# Patient Record
Sex: Male | Born: 1960 | Race: White | Hispanic: No | Marital: Married | Smoking: Never smoker
Health system: Southern US, Community
[De-identification: ages and names within clinical notes are randomized; demographics above are authoritative.]

## PROBLEM LIST (undated history)

## (undated) DIAGNOSIS — H269 Unspecified cataract: Secondary | ICD-10-CM

## (undated) DIAGNOSIS — Z8619 Personal history of other infectious and parasitic diseases: Secondary | ICD-10-CM

## (undated) DIAGNOSIS — G709 Myoneural disorder, unspecified: Secondary | ICD-10-CM

## (undated) DIAGNOSIS — R42 Dizziness and giddiness: Secondary | ICD-10-CM

## (undated) HISTORY — DX: Unspecified cataract: H26.9

## (undated) HISTORY — DX: Myoneural disorder, unspecified: G70.9

## (undated) HISTORY — DX: Personal history of other infectious and parasitic diseases: Z86.19

---

## 2010-04-24 DIAGNOSIS — Z800 Family history of malignant neoplasm of digestive organs: Secondary | ICD-10-CM | POA: Insufficient documentation

## 2010-06-18 ENCOUNTER — Ambulatory Visit: Payer: Self-pay | Admitting: Gastroenterology

## 2010-06-20 LAB — PATHOLOGY REPORT

## 2013-11-22 LAB — BASIC METABOLIC PANEL
BUN: 17 mg/dL (ref 4–21)
Creatinine: 1 mg/dL (ref 0.6–1.3)
Glucose: 99 mg/dL
Potassium: 4.3 mmol/L (ref 3.4–5.3)
Sodium: 142 mmol/L (ref 137–147)

## 2013-11-22 LAB — LIPID PANEL
CHOLESTEROL: 190 mg/dL (ref 0–200)
HDL: 41 mg/dL (ref 35–70)
LDL CALC: 119 mg/dL
TRIGLYCERIDES: 149 mg/dL (ref 40–160)

## 2015-01-03 LAB — PSA: PSA: 0.7

## 2015-02-07 ENCOUNTER — Ambulatory Visit
Admit: 2015-02-07 | Disposition: A | Discharge status: Home / Self Care | Payer: Self-pay | Attending: Otolaryngology | Admitting: Otolaryngology

## 2015-10-09 ENCOUNTER — Ambulatory Visit: Payer: Managed Care, Other (non HMO) | Admitting: Anesthesiology

## 2015-10-09 ENCOUNTER — Encounter
Admission: RE | Disposition: A | Discharge status: Home / Self Care | Payer: Self-pay | Source: Ambulatory Visit | Attending: Gastroenterology

## 2015-10-09 ENCOUNTER — Ambulatory Visit
Admission: RE | Admit: 2015-10-09 | Discharge: 2015-10-09 | Disposition: A | Discharge status: Home / Self Care | Payer: Managed Care, Other (non HMO) | Source: Ambulatory Visit | Attending: Gastroenterology | Admitting: Gastroenterology

## 2015-10-09 DIAGNOSIS — D122 Benign neoplasm of ascending colon: Secondary | ICD-10-CM | POA: Diagnosis not present

## 2015-10-09 DIAGNOSIS — Z8371 Family history of colonic polyps: Secondary | ICD-10-CM | POA: Diagnosis present

## 2015-10-09 DIAGNOSIS — Z86010 Personal history of colonic polyps: Secondary | ICD-10-CM | POA: Diagnosis not present

## 2015-10-09 DIAGNOSIS — Z800 Family history of malignant neoplasm of digestive organs: Secondary | ICD-10-CM | POA: Insufficient documentation

## 2015-10-09 DIAGNOSIS — Z1211 Encounter for screening for malignant neoplasm of colon: Secondary | ICD-10-CM | POA: Diagnosis not present

## 2015-10-09 HISTORY — DX: Dizziness and giddiness: R42

## 2015-10-09 HISTORY — PX: COLONOSCOPY WITH PROPOFOL: SHX5780

## 2015-10-09 SURGERY — COLONOSCOPY WITH PROPOFOL
Anesthesia: General

## 2015-10-09 MED ORDER — PROPOFOL 500 MG/50ML IV EMUL
INTRAVENOUS | Status: DC | PRN
  Administered 2015-10-09: 80 ug/kg/min via INTRAVENOUS

## 2015-10-09 MED ORDER — LIDOCAINE HCL (CARDIAC) 20 MG/ML IV SOLN
INTRAVENOUS | Status: DC | PRN
  Administered 2015-10-09: 60 mg via INTRAVENOUS

## 2015-10-09 MED ORDER — PROPOFOL 10 MG/ML IV BOLUS
INTRAVENOUS | Status: DC | PRN
  Administered 2015-10-09 (×3): 20 mg via INTRAVENOUS

## 2015-10-09 MED ORDER — FENTANYL CITRATE (PF) 100 MCG/2ML IJ SOLN
INTRAMUSCULAR | Status: DC | PRN
  Administered 2015-10-09 (×2): 50 ug via INTRAVENOUS

## 2015-10-09 MED ORDER — SODIUM CHLORIDE 0.9 % IV SOLN: INTRAVENOUS | Status: DC

## 2015-10-09 MED ORDER — MIDAZOLAM HCL 2 MG/2ML IJ SOLN
INTRAMUSCULAR | Status: DC | PRN
  Administered 2015-10-09: 1 mg via INTRAVENOUS

## 2015-10-09 MED ORDER — SODIUM CHLORIDE 0.9 % IV SOLN
INTRAVENOUS | Status: DC
  Administered 2015-10-09: 1000 mL via INTRAVENOUS

## 2015-10-09 NOTE — Op Note (Signed)
[facility] Gastroenterology Patient Name: Paul Espinoza Procedure Date: 10/09/2015 10:31 AM MRN: [id number] Account #: [id number] Date of Birth: 1960-11-17 Admit Type: Outpatient Age: 54 Room: [facility] ENDO ROOM 4 Gender: Male Note Status: Finalized Procedure:         Colonoscopy Indications:       Family history of colon cancer in a first-degree relative,                     Family history of colonic polyps in a first-degree                     relative, Personal history of colonic polyps Providers:         Lupita Dawn. Candace Cruise, MD Referring MD:      Kirstie Peri. Caryn Section, MD (Referring MD) Medicines:         Monitored Anesthesia Care Complications:     No immediate complications. Procedure:         Pre-Anesthesia Assessment:                    - Prior to the procedure, a History and Physical was                     performed, and patient medications, allergies and                     sensitivities were reviewed. The patient's tolerance of                     previous anesthesia was reviewed.                    - The risks and benefits of the procedure and the sedation                     options and risks were discussed with the patient. All                     questions were answered and informed consent was obtained.                    - After reviewing the risks and benefits, the patient was                     deemed in satisfactory condition to undergo the procedure.                    After obtaining informed consent, the colonoscope was                     passed under direct vision. Throughout the procedure, the                     patient's blood pressure, pulse, and oxygen saturations                     were monitored continuously. The Olympus PCF-H180AL                     colonoscope ( S#: [id number] ) was introduced through the                     anus and advanced to the the cecum, identified by  appendiceal orifice and ileocecal valve. The colonoscopy                      was performed without difficulty. The patient tolerated                     the procedure well. The quality of the bowel preparation                     was good. Findings:      A small polyp was found in the ascending colon. The polyp was sessile.       The polyp was removed with a cold snare. Resection and retrieval were       complete.      The exam was otherwise without abnormality. Impression:        - One small polyp in the ascending colon. Resected and                     retrieved.                    - The examination was otherwise normal. Recommendation:    - Discharge patient to home.                    - Await pathology results.                    - Repeat colonoscopy in 5 years for surveillance.                    - The findings and recommendations were discussed with the                     patient. Procedure Code(s): --- Professional ---                    [phone], Colonoscopy, flexible; with removal of tumor(s),                     polyp(s), or other lesion(s) by snare technique Diagnosis Code(s): --- Professional ---                    D12.2, Benign neoplasm of ascending colon                    Z80.0, Family history of malignant neoplasm of digestive                     organs                    Z83.71, Family history of colonic polyps                    Z86.010, Personal history of colonic polyps CPT copyright 2014 American Medical Association. All rights reserved. The codes documented in this report are preliminary and upon coder review may  be revised to meet current compliance requirements. Hulen Luster, MD 10/09/2015 10:55:29 AM This report has been signed electronically. Number of Addenda: 0 Note Initiated On: 10/09/2015 10:31 AM Scope Withdrawal Time: 0 hours 7 minutes 15 seconds  Total Procedure Duration: 0 hours 14 minutes 48 seconds       [facility]

## 2015-10-09 NOTE — Transfer of Care (Signed)
Immediate Anesthesia Transfer of Care Note  Patient: Paul Espinoza  Procedure(s) Performed: Procedure(s): COLONOSCOPY WITH PROPOFOL (N/A)  Patient Location: PACU  Anesthesia Type:General  Level of Consciousness: awake, alert  and oriented  Airway & Oxygen Therapy: Patient Spontanous Breathing and Patient connected to nasal cannula oxygen  Post-op Assessment: Report given to RN and Post -op Vital signs reviewed and stable  Post vital signs: Reviewed and stable  Last Vitals:  Filed Vitals:   10/09/15 0931  BP: 127/73  Pulse: 54  Temp: 36.4 C  Resp: 19    Complications: No apparent anesthesia complications

## 2015-10-09 NOTE — Anesthesia Postprocedure Evaluation (Signed)
Anesthesia Post Note  Patient: Paul Espinoza  Procedure(s) Performed: Procedure(s) (LRB): COLONOSCOPY WITH PROPOFOL (N/A)  Patient location during evaluation: Endoscopy Anesthesia Type: General Level of consciousness: awake and alert Pain management: pain level controlled Vital Signs Assessment: post-procedure vital signs reviewed and stable Respiratory status: spontaneous breathing, nonlabored ventilation, respiratory function stable and patient connected to nasal cannula oxygen Cardiovascular status: blood pressure returned to baseline and stable Postop Assessment: no signs of nausea or vomiting Anesthetic complications: no    Last Vitals:  Filed Vitals:   10/09/15 1121 10/09/15 1131  BP: 117/74 119/60  Pulse: 50 50  Temp:    Resp: 12 14    Last Pain: There were no vitals filed for this visit.               Martha Clan

## 2015-10-09 NOTE — H&P (Signed)
    Primary Care Physician:  [name], MD Primary Gastroenterologist:  Dr. Candace Cruise  Pre-Procedure History & Physical: HPI:  Paul Espinoza is a 54 y.o. male is here for an colonoscopy.   Past Medical History  Diagnosis Date  . Vertigo     Past Surgical History  Procedure Laterality Date  . Colonoscopy      Prior to Admission medications   Medication Sig Start Date End Date Taking? Authorizing Provider  fluticasone (FLONASE) 50 MCG/ACT nasal spray Place 2 sprays into both nostrils daily.   Yes Historical Provider, MD  hydrochlorothiazide (HYDRODIURIL) 25 MG tablet Take 25 mg by mouth daily.   Yes Historical Provider, MD  Multiple Vitamin (MULTIVITAMIN) capsule Take 1 capsule by mouth daily.   Yes Historical Provider, MD    Allergies as of 06/21/2015  . (Not on File)    History reviewed. No pertinent family history.  Social History   Social History  . Marital Status: Married    Spouse Name: N/A  . Number of Children: N/A  . Years of Education: N/A   Occupational History  . Not on file.   Social History Main Topics  . Smoking status: Never Smoker   . Smokeless tobacco: Not on file  . Alcohol Use: Not on file  . Drug Use: Not on file  . Sexual Activity: Not on file   Other Topics Concern  . Not on file   Social History Narrative  . No narrative on file    Review of Systems: See HPI, otherwise negative ROS  Physical Exam: BP 127/73 mmHg  Pulse 54  Temp(Src) 97.6 F (36.4 C) (Tympanic)  Resp 19  Ht 6\' 5"  (1.956 m)  Wt 87.091 kg (192 lb)  BMI 22.76 kg/m2  SpO2 100% General:   Alert,  pleasant and cooperative in NAD Head:  Normocephalic and atraumatic. Neck:  Supple; no masses or thyromegaly. Lungs:  Clear throughout to auscultation.    Heart:  Regular rate and rhythm. Abdomen:  Soft, nontender and nondistended. Normal bowel sounds, without guarding, and without rebound.   Neurologic:  Alert and  oriented x4;  grossly normal  neurologically.  Impression/Plan: Paul Espinoza is here for an colonoscopy to be performed for personal hx of colon polyps/family hx of colon polyps/cancer  Risks, benefits, limitations, and alternatives regarding  colonoscoppy have been reviewed with the patient.  Questions have been answered.  All parties agreeable.   Delfina Schreurs, Lupita Dawn, MD  10/09/2015, 9:36 AM

## 2015-10-09 NOTE — Anesthesia Preprocedure Evaluation (Signed)
Anesthesia Evaluation  Patient identified by MRN, date of birth, ID band Patient awake    Reviewed: Allergy & Precautions, H&P , NPO status , Patient's Chart, lab work & pertinent test results, reviewed documented beta blocker date and time   History of Anesthesia Complications Negative for: history of anesthetic complications  Airway Mallampati: I  TM Distance: >3 FB Neck ROM: full    Dental no notable dental hx. (+) Caps, Teeth Intact   Pulmonary neg pulmonary ROS,    Pulmonary exam normal breath sounds clear to auscultation       Cardiovascular Exercise Tolerance: Good negative cardio ROS Normal cardiovascular exam Rhythm:regular Rate:Normal     Neuro/Psych negative neurological ROS  negative psych ROS   GI/Hepatic negative GI ROS, Neg liver ROS,   Endo/Other  negative endocrine ROS  Renal/GU negative Renal ROS  negative genitourinary   Musculoskeletal   Abdominal   Peds  Hematology negative hematology ROS (+)   Anesthesia Other Findings Past Medical History:   Vertigo                                                      Reproductive/Obstetrics negative OB ROS                             Anesthesia Physical Anesthesia Plan  ASA: I  Anesthesia Plan: General   Post-op Pain Management:    Induction:   Airway Management Planned:   Additional Equipment:   Intra-op Plan:   Post-operative Plan:   Informed Consent: I have reviewed the patients History and Physical, chart, labs and discussed the procedure including the risks, benefits and alternatives for the proposed anesthesia with the patient or authorized representative who has indicated his/her understanding and acceptance.   Dental Advisory Given  Plan Discussed with: Anesthesiologist, CRNA and Surgeon  Anesthesia Plan Comments:         Anesthesia Quick Evaluation

## 2015-10-10 DIAGNOSIS — Z86010 Personal history of colonic polyps: Secondary | ICD-10-CM

## 2015-10-10 LAB — SURGICAL PATHOLOGY

## 2015-12-15 DIAGNOSIS — H8109 Meniere's disease, unspecified ear: Secondary | ICD-10-CM | POA: Insufficient documentation

## 2016-01-09 ENCOUNTER — Ambulatory Visit (INDEPENDENT_AMBULATORY_CARE_PROVIDER_SITE_OTHER): Payer: Managed Care, Other (non HMO) | Admitting: Family Medicine

## 2016-01-09 ENCOUNTER — Encounter: Payer: Self-pay | Admitting: Family Medicine

## 2016-01-09 VITALS — BP 102/64 | HR 72 | Temp 98.1°F | Resp 16 | Ht 77.0 in | Wt 206.0 lb

## 2016-01-09 DIAGNOSIS — Z0000 Encounter for general adult medical examination without abnormal findings: Secondary | ICD-10-CM | POA: Diagnosis not present

## 2016-01-09 DIAGNOSIS — R42 Dizziness and giddiness: Secondary | ICD-10-CM | POA: Diagnosis not present

## 2016-01-09 DIAGNOSIS — R002 Palpitations: Secondary | ICD-10-CM | POA: Diagnosis not present

## 2016-01-09 NOTE — Progress Notes (Signed)
Patient: Paul Espinoza, Male    DOB: 02-Nov-1961, 55 y.o.   MRN: [id number] Visit Date: 01/09/2016  Today's Provider: [name], MD   Chief Complaint  Patient presents with  . Annual Exam   Subjective:    Annual physical exam [name] is a 55 y.o. male who presents today for health maintenance and complete physical. He feels well. He reports exercising yes/running. He reports he is sleeping well.  ----------------------------------------------------------------- He has generally felt well. He states that last weekend he had just finished a 7 mile run when he started having several brief dizzy spells and nearly passed out. They were associated with heart palpitations, but no pain or dyspnea. He reports several episodes of palpitations over his life time which were mostly associated with caffeine consumption. He states he has not consumed caffeine recently. Has not been taking any new vitamins or supplements, and has not been otherwise ill recently. He does take 68mc hctz daily for Meniere's disease which he states has been fairly effective.   Review of Systems  Constitutional: Negative.   HENT: Positive for hearing loss and tinnitus.   Eyes: Positive for visual disturbance.  Respiratory: Negative.   Cardiovascular: Positive for palpitations.  Gastrointestinal: Negative.   Endocrine: Negative.   Genitourinary: Negative.   Musculoskeletal: Negative.   Skin: Negative.   Allergic/Immunologic: Negative.   Neurological: Positive for light-headedness.  Hematological: Negative.   Psychiatric/Behavioral: Negative.     Social History      He  reports that he has never smoked. He does not have any smokeless tobacco history on file.       Social History   Social History  . Marital Status: Married    Spouse Name: N/A  . Number of Children: N/A  . Years of Education: N/A   Social History Main Topics  . Smoking status: Never Smoker   . Smokeless tobacco: None   . Alcohol Use: None  . Drug Use: None  . Sexual Activity: Not Asked   Other Topics Concern  . None   Social History Narrative    Past Medical History  Diagnosis Date  . Vertigo      Patient Active Problem List   Diagnosis Date Noted  . Dizziness 01/09/2016  . Palpitations 01/09/2016  . Meniere disease 12/15/2015  . History of adenomatous polyp of colon 10/10/2015  . Family history of cancer of digestive system 04/24/2010    Past Surgical History  Procedure Laterality Date  . Colonoscopy with propofol N/A 10/09/2015    Procedure: COLONOSCOPY WITH PROPOFOL;  Surgeon: Hulen Luster, MD;  Location: [facility] ENDOSCOPY;  Service: Gastroenterology;  Laterality: N/A;    Family History        Family Status  Relation Status Death Age  . Mother Deceased   . Father Deceased 69  . Brother Alive   . Brother Alive   . Brother Alive         His family history includes Breast cancer in his mother; Colon cancer in his brother and father; Colon polyps in his father and mother; Heart disease in his father and mother.  His father had MI in his 64s and had 4V CABG.   No Known Allergies  Previous Medications   FLUTICASONE (FLONASE) 50 MCG/ACT NASAL SPRAY    Place 2 sprays into both nostrils daily.   HYDROCHLOROTHIAZIDE (HYDRODIURIL) 25 MG TABLET    Take 25 mg by mouth daily.   MULTIPLE VITAMIN (MULTIVITAMIN)  CAPSULE    Take 1 capsule by mouth daily.    Patient Care Team: Birdie Sons, MD as PCP - General (Family Medicine)     Objective:   Vitals: BP 102/64 mmHg  Pulse 72  Temp(Src) 98.1 F (36.7 C) (Oral)  Resp 16  Ht 6\' 5"  (1.956 m)  Wt 206 lb (93.441 kg)  BMI 24.42 kg/m2  SpO2 98%   Physical Exam   General Appearance:    Alert, cooperative, no distress, appears stated age  Head:    Normocephalic, without obvious abnormality, atraumatic  Eyes:    PERRL, conjunctiva/corneas clear, EOM's intact, fundi    benign, both eyes       Ears:    Normal TM's and external ear  canals, both ears  Nose:   Nares normal, septum midline, mucosa normal, no drainage   or sinus tenderness  Throat:   Lips, mucosa, and tongue normal; teeth and gums normal  Neck:   Supple, symmetrical, trachea midline, no adenopathy;       thyroid:  No enlargement/tenderness/nodules; no carotid   bruit or JVD  Back:     Symmetric, no curvature, ROM normal, no CVA tenderness  Lungs:     Clear to auscultation bilaterally, respirations unlabored  Chest wall:    No tenderness or deformity  Heart:    Regular rate and rhythm, S1 and S2 normal, no murmur, rub   or gallop  Abdomen:     Soft, non-tender, bowel sounds active all four quadrants,    no masses, no organomegaly  Genitalia:    deferred  Rectal:    deferred  Extremities:   Extremities normal, atraumatic, no cyanosis or edema  Pulses:   2+ and symmetric all extremities  Skin:   Skin color, texture, turgor normal, no rashes or lesions  Lymph nodes:   Cervical, supraclavicular, and axillary nodes normal  Neurologic:   CNII-XII intact. Normal strength, sensation and reflexes      throughout    Depression Screen PHQ 2/9 Scores 01/09/2016  PHQ - 2 Score 0  PHQ- 9 Score 0   EKG: NSR. No ischemic changes.    Assessment & Plan:     Routine Health Maintenance and Physical Exam  Exercise Activities and Dietary recommendations Goals    None      Immunization History  Administered Date(s) Administered  . Tdap 04/24/2010    Health Maintenance  Topic Date Due  . HIV Screening  05/25/1975  . INFLUENZA VACCINE  02/02/2016 (Originally 06/05/2015)  . TETANUS/TDAP  04/24/2020  . COLONOSCOPY  10/08/2020  . Hepatitis C Screening  Completed      Discussed health benefits of physical activity, and encouraged him to engage in regular exercise appropriate for his age and condition.    --------------------------------------------------------------------  1. Annual physical exam  - Comprehensive metabolic panel - CBC - T4 AND  TSH - Lipid panel - PSA  2. Dizziness Only last one day, but associated with palpitations as above. Suspect he was orthostatic, possibly aggravated by hctz which he takes for Meniere's disease. He is an avid runner and likely has low baseline blood pressure. Encourage increase fluid consumption.  - Comprehensive metabolic panel - CBC - T4 AND TSH - EKG 12-Lead  3. Palpitations Consider magnesium supplementation as he is on chronic thiazide diuretic.  His only cardiac risk factor is family history, he is interested in considering cardiac stress test.   - EKG 12-Lead - Magnesium

## 2016-01-10 ENCOUNTER — Telehealth: Payer: Self-pay | Admitting: Family Medicine

## 2016-01-10 DIAGNOSIS — R42 Dizziness and giddiness: Secondary | ICD-10-CM

## 2016-01-10 DIAGNOSIS — R002 Palpitations: Secondary | ICD-10-CM

## 2016-01-10 LAB — COMPREHENSIVE METABOLIC PANEL
ALBUMIN: 4.5 g/dL (ref 3.5–5.5)
ALT: 24 IU/L (ref 0–44)
AST: 19 IU/L (ref 0–40)
Albumin/Globulin Ratio: 2.3 (ref 1.1–2.5)
Alkaline Phosphatase: 62 IU/L (ref 39–117)
BUN / CREAT RATIO: 17 (ref 9–20)
BUN: 20 mg/dL (ref 6–24)
Bilirubin Total: 0.7 mg/dL (ref 0.0–1.2)
CALCIUM: 9.5 mg/dL (ref 8.7–10.2)
CO2: 28 mmol/L (ref 18–29)
CREATININE: 1.21 mg/dL (ref 0.76–1.27)
Chloride: 97 mmol/L (ref 96–106)
GFR calc Af Amer: 77 mL/min/{1.73_m2} (ref >59)
GFR, EST NON AFRICAN AMERICAN: 67 mL/min/{1.73_m2} (ref >59)
GLOBULIN, TOTAL: 2 g/dL (ref 1.5–4.5)
Glucose: 99 mg/dL (ref 65–99)
Potassium: 4.4 mmol/L (ref 3.5–5.2)
SODIUM: 139 mmol/L (ref 134–144)
TOTAL PROTEIN: 6.5 g/dL (ref 6.0–8.5)

## 2016-01-10 LAB — LIPID PANEL
CHOL/HDL RATIO: 4.4 ratio (ref 0.0–5.0)
Cholesterol, Total: 171 mg/dL (ref 100–199)
HDL: 39 mg/dL — ABNORMAL LOW (ref >39)
LDL CALC: 110 mg/dL — AB (ref 0–99)
TRIGLYCERIDES: 112 mg/dL (ref 0–149)
VLDL CHOLESTEROL CAL: 22 mg/dL (ref 5–40)

## 2016-01-10 LAB — PSA: Prostate Specific Ag, Serum: 0.5 ng/mL (ref 0.0–4.0)

## 2016-01-10 LAB — T4 AND TSH
T4, Total: 6.7 ug/dL (ref 4.5–12.0)
TSH: 3.01 u[IU]/mL (ref 0.450–4.500)

## 2016-01-10 LAB — CBC
Hematocrit: 44.1 % (ref 37.5–51.0)
Hemoglobin: 15.5 g/dL (ref 12.6–17.7)
MCH: 32.5 pg (ref 26.6–33.0)
MCHC: 35.1 g/dL (ref 31.5–35.7)
MCV: 93 fL (ref 79–97)
PLATELETS: 178 10*3/uL (ref 150–379)
RBC: 4.77 x10E6/uL (ref 4.14–5.80)
RDW: 13.4 % (ref 12.3–15.4)
WBC: 6.1 10*3/uL (ref 3.4–10.8)

## 2016-01-10 LAB — MAGNESIUM: MAGNESIUM: 2.2 mg/dL (ref 1.6–2.3)

## 2016-01-10 NOTE — Telephone Encounter (Signed)
-----   Message from April M Miller, [place] sent at 01/10/2016  9:50 AM EST ----- Patient was notified of results. Patient expressed understanding. Patient is agreeable to having stress test.

## 2016-01-10 NOTE — Telephone Encounter (Signed)
This patient needs an exercise EKG stress test (Not a myoview) I don't know how to order this in Epic. Thanks.

## 2016-01-11 ENCOUNTER — Other Ambulatory Visit: Payer: Self-pay | Admitting: Family Medicine

## 2016-01-11 NOTE — Telephone Encounter (Signed)
For exercise tolerance test,the code is [id number]

## 2016-01-11 NOTE — Telephone Encounter (Signed)
Order in epic. 

## 2016-01-12 NOTE — Telephone Encounter (Signed)
Appointment scheduled at [facility] ([facility]) 01/16/16 at 7:30

## 2016-01-16 ENCOUNTER — Ambulatory Visit: Payer: Managed Care, Other (non HMO)

## 2016-01-17 ENCOUNTER — Ambulatory Visit
Admission: RE | Admit: 2016-01-17 | Discharge: 2016-01-17 | Disposition: A | Discharge status: Home / Self Care | Payer: Managed Care, Other (non HMO) | Source: Ambulatory Visit | Attending: Family Medicine | Admitting: Family Medicine

## 2016-01-17 DIAGNOSIS — R42 Dizziness and giddiness: Secondary | ICD-10-CM | POA: Diagnosis not present

## 2016-01-17 DIAGNOSIS — R002 Palpitations: Secondary | ICD-10-CM | POA: Diagnosis not present

## 2016-01-17 LAB — EXERCISE TOLERANCE TEST
CHL CUP RESTING HR STRESS: 56 {beats}/min
CSEPED: 15 min
CSEPEDS: 0 s
CSEPEW: 17.5 METS
Peak HR: 166 {beats}/min

## 2016-11-19 ENCOUNTER — Ambulatory Visit (INDEPENDENT_AMBULATORY_CARE_PROVIDER_SITE_OTHER): Payer: Managed Care, Other (non HMO) | Admitting: Family Medicine

## 2016-11-19 ENCOUNTER — Encounter: Payer: Self-pay | Admitting: Family Medicine

## 2016-11-19 VITALS — BP 130/76 | HR 85 | Temp 98.7°F | Resp 16 | Wt 208.0 lb

## 2016-11-19 DIAGNOSIS — J111 Influenza due to unidentified influenza virus with other respiratory manifestations: Secondary | ICD-10-CM

## 2016-11-19 MED ORDER — HYDROCODONE-HOMATROPINE 5-1.5 MG/5ML PO SYRP: ORAL_SOLUTION | ORAL | 0 refills | Status: DC

## 2016-11-19 NOTE — Progress Notes (Addendum)
Subjective:     Patient ID: Paul Espinoza, male   DOB: [birth year], 56 y.o.   MRN: [id number]  HPI  Chief Complaint  Patient presents with  . URI    Patient comes in office today with concerns of cold like symptoms such as cough, congestion and fever since 11/18/15. Patient states that he has been busy with business meetings and traveling and dosent know if thats when symptoms started. Patient stated that he has fatigue, chilld in the evenings and fever ranging from 99.5-101.2. Patient has been taking otc Mucinex 12hr, Sudafed and Cough suppressant   Reports headache, skin sensitivity, and fatigue. No flu shot this season   Review of Systems     Objective:   Physical Exam  Constitutional: He appears well-developed and well-nourished. No distress.  Ears: T.M's intact without inflammationr Throat: no tonsillar enlargement or exudate Neck: bilateral anterior cervical nodes Lungs: clear     Assessment:    1. Flu - HYDROcodone-homatropine (HYCODAN) 5-1.5 MG/5ML syrup; 5 ml 4-6 hours as needed for cough  Dispense: 240 mL; Refill: 0    Plan:    Continue current medication. Encouraged staying home from work if possible.

## 2016-11-19 NOTE — Patient Instructions (Signed)
Continue Mucinex D and Delsym or similar for cough.

## 2017-01-09 ENCOUNTER — Ambulatory Visit (INDEPENDENT_AMBULATORY_CARE_PROVIDER_SITE_OTHER): Payer: Managed Care, Other (non HMO) | Admitting: Family Medicine

## 2017-01-09 ENCOUNTER — Encounter: Payer: Self-pay | Admitting: Family Medicine

## 2017-01-09 VITALS — BP 132/64 | HR 54 | Temp 97.9°F | Resp 16 | Ht 77.0 in | Wt 210.0 lb

## 2017-01-09 DIAGNOSIS — Z800 Family history of malignant neoplasm of digestive organs: Secondary | ICD-10-CM

## 2017-01-09 DIAGNOSIS — Z0000 Encounter for general adult medical examination without abnormal findings: Secondary | ICD-10-CM | POA: Diagnosis not present

## 2017-01-09 NOTE — Patient Instructions (Signed)
Preventive Care 40-64 Years, Male Preventive care refers to lifestyle choices and visits with your health care provider that can promote health and wellness. What does preventive care include?  A yearly physical exam. This is also called an annual well check.  Dental exams once or twice a year.  Routine eye exams. Ask your health care provider how often you should have your eyes checked.  Personal lifestyle choices, including:  Daily care of your teeth and gums.  Regular physical activity.  Eating a healthy diet.  Avoiding tobacco and drug use.  Limiting alcohol use.  Practicing safe sex.  Taking low-dose aspirin every day starting at age 50. What happens during an annual well check? The services and screenings done by your health care provider during your annual well check will depend on your age, overall health, lifestyle risk factors, and family history of disease. Counseling  Your health care provider may ask you questions about your:  Alcohol use.  Tobacco use.  Drug use.  Emotional well-being.  Home and relationship well-being.  Sexual activity.  Eating habits.  Work and work environment. Screening  You may have the following tests or measurements:  Height, weight, and BMI.  Blood pressure.  Lipid and cholesterol levels. These may be checked every 5 years, or more frequently if you are over 50 years old.  Skin check.  Lung cancer screening. You may have this screening every year starting at age 55 if you have a 30-pack-year history of smoking and currently smoke or have quit within the past 15 years.  Fecal occult blood test (FOBT) of the stool. You may have this test every year starting at age 50.  Flexible sigmoidoscopy or colonoscopy. You may have a sigmoidoscopy every 5 years or a colonoscopy every 10 years starting at age 50.  Prostate cancer screening. Recommendations will vary depending on your family history and other risks.  Hepatitis C  blood test.  Hepatitis B blood test.  Sexually transmitted disease (STD) testing.  Diabetes screening. This is done by checking your blood sugar (glucose) after you have not eaten for a while (fasting). You may have this done every 1-3 years. Discuss your test results, treatment options, and if necessary, the need for more tests with your health care provider. Vaccines  Your health care provider may recommend certain vaccines, such as:  Influenza vaccine. This is recommended every year.  Tetanus, diphtheria, and acellular pertussis (Tdap, Td) vaccine. You may need a Td booster every 10 years.  Varicella vaccine. You may need this if you have not been vaccinated.  Zoster vaccine. You may need this after age 60.  Measles, mumps, and rubella (MMR) vaccine. You may need at least one dose of MMR if you were born in 1957 or later. You may also need a second dose.  Pneumococcal 13-valent conjugate (PCV13) vaccine. You may need this if you have certain conditions and have not been vaccinated.  Pneumococcal polysaccharide (PPSV23) vaccine. You may need one or two doses if you smoke cigarettes or if you have certain conditions.  Meningococcal vaccine. You may need this if you have certain conditions.  Hepatitis A vaccine. You may need this if you have certain conditions or if you travel or work in places where you may be exposed to hepatitis A.  Hepatitis B vaccine. You may need this if you have certain conditions or if you travel or work in places where you may be exposed to hepatitis B.  Haemophilus influenzae type b (Hib)   vaccine. You may need this if you have certain risk factors. Talk to your health care provider about which screenings and vaccines you need and how often you need them. This information is not intended to replace advice given to you by your health care provider. Make sure you discuss any questions you have with your health care provider. Document Released: 11/17/2015  Document Revised: 07/10/2016 Document Reviewed: 08/22/2015 Elsevier Interactive Patient Education  2017 [employer].

## 2017-01-09 NOTE — Progress Notes (Signed)
Patient: Paul Espinoza, Male    DOB: [birth year], 56 y.o.   MRN: [id number] Visit Date: 01/09/2017  Today's Provider: [name], MD   Chief Complaint  Patient presents with  . Annual Exam   Subjective:    Annual physical exam Paul Espinoza is a 56 y.o. male who presents today for health maintenance and complete physical. He feels well. He reports exercising occasionally . He reports he is sleeping well. 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  Colonoscopy- 10/09/2015. Repeat in 5 years.    Review of Systems  Constitutional: Negative.  Negative for chills, diaphoresis and fever.  HENT: Negative.  Negative for congestion, ear discharge, ear pain, hearing loss, nosebleeds, sore throat and tinnitus.   Eyes: Negative.  Negative for photophobia, pain, discharge and redness.  Respiratory: Negative.  Negative for cough, shortness of breath, wheezing and stridor.   Cardiovascular: Negative.  Negative for chest pain, palpitations and leg swelling.  Gastrointestinal: Negative.  Negative for abdominal pain, blood in stool, constipation, diarrhea, nausea and vomiting.  Endocrine: Negative.  Negative for polydipsia.  Genitourinary: Negative.  Negative for dysuria, flank pain, frequency, hematuria and urgency.  Musculoskeletal: Negative.  Negative for back pain, myalgias and neck pain.  Skin: Negative.  Negative for rash.  Allergic/Immunologic: Negative.  Negative for environmental allergies.  Neurological: Negative.  Negative for dizziness, tremors, seizures, weakness and headaches.  Hematological: Negative.  Does not bruise/bleed easily.  Psychiatric/Behavioral: Negative.  Negative for hallucinations and suicidal ideas. The patient is not nervous/anxious.     Social History      He  reports that he has never smoked. He has never used smokeless tobacco.       Social History   Social History  . Marital status: Married    Spouse  name: N/A  . Number of children: N/A  . Years of education: N/A   Occupational History  .      [employer]   Social History Main Topics  . Smoking status: Never Smoker  . Smokeless tobacco: Never Used  . Alcohol use Not on file  . Drug use: Unknown  . Sexual activity: Not on file   Other Topics Concern  . Not on file   Social History Narrative  . No narrative on file    Past Medical History:  Diagnosis Date  . History of chicken pox   . Vertigo      Patient Active Problem List   Diagnosis Date Noted  . Dizziness 01/09/2016  . Palpitations 01/09/2016  . Meniere disease 12/15/2015  . History of adenomatous polyp of colon 10/10/2015  . Family history of cancer of digestive system 04/24/2010    Past Surgical History:  Procedure Laterality Date  . COLONOSCOPY WITH PROPOFOL N/A 10/09/2015   Procedure: COLONOSCOPY WITH PROPOFOL;  Surgeon: Hulen Luster, MD;  Location: [facility] ENDOSCOPY;  Service: Gastroenterology;  Laterality: N/A;    Family History        Family Status  Relation Status  . Mother Deceased  . Father Deceased at age 43  . Brother Alive  . Brother Alive  . Brother Alive        His family history includes Breast cancer in his mother; Colon cancer in his brother and father; Colon polyps in his father and mother; Heart disease in his father and mother.     No Known Allergies   Current Outpatient Prescriptions:  .  fluticasone (FLONASE) 50 MCG/ACT nasal spray, Place 2 sprays into both nostrils daily., Disp: , Rfl:  .  hydrochlorothiazide (HYDRODIURIL) 25 MG tablet, Take 25 mg by mouth daily., Disp: , Rfl:  .  HYDROcodone-homatropine (HYCODAN) 5-1.5 MG/5ML syrup, 5 ml 4-6 hours as needed for cough, Disp: 240 mL, Rfl: 0 .  Multiple Vitamin (MULTIVITAMIN) capsule, Take 1 capsule by mouth daily., Disp: , Rfl:    Patient Care Team: Birdie Sons, MD as PCP - General (Family Medicine)      Objective:   Vitals: BP 132/64 (BP  Location: Left Arm, Patient Position: Sitting, Cuff Size: Normal)   Pulse (!) 54   Temp 97.9 F (36.6 C)   Resp 16   Ht 6\' 5"  (1.956 m)   Wt 210 lb (95.3 kg)   SpO2 99%   BMI 24.90 kg/m       Physical Exam   General Appearance:    Alert, cooperative, no distress, appears stated age  Head:    Normocephalic, without obvious abnormality, atraumatic  Eyes:    PERRL, conjunctiva/corneas clear, EOM's intact, fundi    benign, both eyes       Ears:    Normal TM's and external ear canals, both ears  Nose:   Nares normal, septum midline, mucosa normal, no drainage   or sinus tenderness  Throat:   Lips, mucosa, and tongue normal; teeth and gums normal  Neck:   Supple, symmetrical, trachea midline, no adenopathy;       thyroid:  No enlargement/tenderness/nodules; no carotid   bruit or JVD  Back:     Symmetric, no curvature, ROM normal, no CVA tenderness  Lungs:     Clear to auscultation bilaterally, respirations unlabored  Chest wall:    No tenderness or deformity  Heart:    Regular rate and rhythm, S1 and S2 normal, no murmur, rub   or gallop  Abdomen:     Soft, non-tender, bowel sounds active all four quadrants,    no masses, no organomegaly  Genitalia:    deferred  Rectal:    deferred  Extremities:   Extremities normal, atraumatic, no cyanosis or edema  Pulses:   2+ and symmetric all extremities  Skin:   Skin color, texture, turgor normal, no rashes or lesions  Lymph nodes:   Cervical, supraclavicular, and axillary nodes normal  Neurologic:   CNII-XII intact. Normal strength, sensation and reflexes      throughout    PHQ 2/9 Scores 01/09/2017 01/09/2016  PHQ - 2 Score 0 0  PHQ- 9 Score 1 0      Assessment & Plan:     Routine Health Maintenance and Physical Exam  Exercise Activities and Dietary recommendations Goals    None      Immunization History  Administered Date(s) Administered  . Tdap 04/24/2010    Health Maintenance  Topic Date Due  . INFLUENZA VACCINE   01/09/2018 (Originally 06/04/2016)  . HIV Screening  01/10/2027 (Originally 05/25/1975)  . TETANUS/TDAP  04/24/2020  . COLONOSCOPY  10/08/2020  . Hepatitis C Screening  Completed     Discussed health benefits of physical activity, and encouraged him to engage in regular exercise appropriate for his age and condition.        [name], MD  [facility]

## 2017-01-10 ENCOUNTER — Other Ambulatory Visit: Payer: Self-pay | Admitting: Family Medicine

## 2017-01-10 ENCOUNTER — Telehealth: Payer: Self-pay

## 2017-01-10 LAB — COMPREHENSIVE METABOLIC PANEL
A/G RATIO: 1.8 (ref 1.2–2.2)
ALBUMIN: 4.4 g/dL (ref 3.5–5.5)
ALT: 33 IU/L (ref 0–44)
AST: 22 IU/L (ref 0–40)
Alkaline Phosphatase: 66 IU/L (ref 39–117)
BUN/Creatinine Ratio: 18 (ref 9–20)
BUN: 22 mg/dL (ref 6–24)
Bilirubin Total: 0.6 mg/dL (ref 0.0–1.2)
CALCIUM: 9.6 mg/dL (ref 8.7–10.2)
CO2: 30 mmol/L — ABNORMAL HIGH (ref 18–29)
Chloride: 97 mmol/L (ref 96–106)
Creatinine, Ser: 1.21 mg/dL (ref 0.76–1.27)
GFR, EST AFRICAN AMERICAN: 77 mL/min/{1.73_m2} (ref >59)
GFR, EST NON AFRICAN AMERICAN: 67 mL/min/{1.73_m2} (ref >59)
Globulin, Total: 2.5 g/dL (ref 1.5–4.5)
Glucose: 103 mg/dL — ABNORMAL HIGH (ref 65–99)
POTASSIUM: 4.3 mmol/L (ref 3.5–5.2)
Sodium: 141 mmol/L (ref 134–144)
TOTAL PROTEIN: 6.9 g/dL (ref 6.0–8.5)

## 2017-01-10 LAB — LIPID PANEL
CHOL/HDL RATIO: 4.8 ratio (ref 0.0–5.0)
Cholesterol, Total: 187 mg/dL (ref 100–199)
HDL: 39 mg/dL — AB (ref >39)
LDL Calculated: 124 mg/dL — ABNORMAL HIGH (ref 0–99)
Triglycerides: 119 mg/dL (ref 0–149)
VLDL CHOLESTEROL CAL: 24 mg/dL (ref 5–40)

## 2017-01-10 LAB — PSA: PROSTATE SPECIFIC AG, SERUM: 0.6 ng/mL (ref 0.0–4.0)

## 2017-01-10 NOTE — Telephone Encounter (Signed)
-----   Message from Birdie Sons, MD sent at 01/10/2017  8:04 AM EST ----- PSA, blood sugar, kidney functions, electrolytes and cholesterol are all normal. Check labs yearly.

## 2017-01-10 NOTE — Telephone Encounter (Signed)
LMTCB 01/10/2017  Thanks,   -Mickel Baas

## 2017-01-15 NOTE — Telephone Encounter (Signed)
Patient advised.

## 2017-01-15 NOTE — Telephone Encounter (Signed)
LMTCB

## 2018-01-14 ENCOUNTER — Encounter: Payer: Self-pay | Admitting: Family Medicine

## 2018-01-14 ENCOUNTER — Ambulatory Visit: Payer: 59 | Admitting: Family Medicine

## 2018-01-14 VITALS — BP 120/84 | HR 62 | Temp 97.9°F | Resp 16

## 2018-01-14 DIAGNOSIS — M109 Gout, unspecified: Secondary | ICD-10-CM | POA: Diagnosis not present

## 2018-01-14 MED ORDER — COLCHICINE 0.6 MG PO TABS: ORAL_TABLET | ORAL | 0 refills | Status: DC

## 2018-01-14 NOTE — Patient Instructions (Signed)

## 2018-01-14 NOTE — Progress Notes (Signed)
       Patient: Paul Espinoza Male    DOB: Sep 15, 1961   57 y.o.   MRN: [id number] Visit Date: 01/14/2018  Today's Provider: [name], MD   Chief Complaint  Patient presents with  . Gout   Subjective:    Patient states he believes he is having a gout flare-up in left foot. He has had left foot pain for 13 days. Patient's left big toe and foot is swollen. Also some redness in his left foot. Patient states he has gout for several years, that was ordinally treated by Dr. Caryl Comes at [place].   He states he typically gets 1-2 gout flares per year and they usually resolve he increases fluid intake and takes 600mg  ibuprofen every 4 hours, but current flare is not responding.      No Known Allergies   Current Outpatient Medications:  .  fluticasone (FLONASE) 50 MCG/ACT nasal spray, Place 2 sprays into both nostrils daily., Disp: , Rfl:  .  hydrochlorothiazide (HYDRODIURIL) 25 MG tablet, Take 25 mg by mouth daily., Disp: , Rfl:  .  Multiple Vitamin (MULTIVITAMIN) capsule, Take 1 capsule by mouth daily., Disp: , Rfl:   Review of Systems  Constitutional: Negative for appetite change, chills and fever.  Respiratory: Negative for chest tightness, shortness of breath and wheezing.   Cardiovascular: Negative for chest pain and palpitations.  Gastrointestinal: Negative for abdominal pain, nausea and vomiting.    Social History   Tobacco Use  . Smoking status: Never Smoker  . Smokeless tobacco: Never Used  Substance Use Topics  . Alcohol use: Not on file   Objective:   BP 120/84 (BP Location: Right Arm, Patient Position: Sitting, Cuff Size: Large)   Pulse 62   Temp 97.9 F (36.6 C) (Oral)   Resp 16   SpO2 99%  Vitals:   01/14/18 0907  BP: 120/84  Pulse: 62  Resp: 16  Temp: 97.9 F (36.6 C)  TempSrc: Oral  SpO2: 99%     Physical Exam  General appearance: alert, well developed, well nourished, cooperative and in no distress Head: Normocephalic, without obvious  abnormality, atraumatic Respiratory: Respirations even and unlabored, normal respiratory rate Extremities: mild swelling of left forefoot mostly around first MTP which is red and tender.      Assessment & Plan:     1. Acute gout involving toe of left foot, unspecified cause  - colchicine 0.6 MG tablet; Take two tablets on first day, then one daily  Dispense: 30 tablet; Refill: 0  Call if symptoms change or if not rapidly improving.    Anticipating checking uric acid at [facility] next month.       [name], MD  [facility]

## 2018-02-02 ENCOUNTER — Ambulatory Visit
Admission: RE | Admit: 2018-02-02 | Discharge: 2018-02-02 | Disposition: A | Discharge status: Home / Self Care | Payer: 59 | Source: Ambulatory Visit | Attending: Family Medicine | Admitting: Family Medicine

## 2018-02-02 ENCOUNTER — Encounter: Payer: Self-pay | Admitting: Family Medicine

## 2018-02-02 ENCOUNTER — Telehealth: Payer: Self-pay | Admitting: *Deleted

## 2018-02-02 ENCOUNTER — Ambulatory Visit: Payer: 59 | Admitting: Family Medicine

## 2018-02-02 VITALS — BP 132/78 | HR 59 | Temp 98.0°F | Resp 16 | Wt 210.0 lb

## 2018-02-02 DIAGNOSIS — M109 Gout, unspecified: Secondary | ICD-10-CM | POA: Diagnosis not present

## 2018-02-02 DIAGNOSIS — Z125 Encounter for screening for malignant neoplasm of prostate: Secondary | ICD-10-CM

## 2018-02-02 DIAGNOSIS — G8929 Other chronic pain: Secondary | ICD-10-CM

## 2018-02-02 DIAGNOSIS — M79675 Pain in left toe(s): Secondary | ICD-10-CM | POA: Diagnosis not present

## 2018-02-02 DIAGNOSIS — Z13228 Encounter for screening for other metabolic disorders: Secondary | ICD-10-CM

## 2018-02-02 DIAGNOSIS — Z13220 Encounter for screening for lipoid disorders: Secondary | ICD-10-CM | POA: Diagnosis not present

## 2018-02-02 DIAGNOSIS — M19072 Primary osteoarthritis, left ankle and foot: Secondary | ICD-10-CM | POA: Insufficient documentation

## 2018-02-02 MED ORDER — INDOMETHACIN 50 MG PO CAPS: 50.0000 mg | ORAL_CAPSULE | Freq: Three times a day (TID) | ORAL | 0 refills | Status: DC

## 2018-02-02 NOTE — Patient Instructions (Signed)
Go to the [facility] on [address] for [employer]

## 2018-02-02 NOTE — Telephone Encounter (Signed)
Patient was notified of results. Expressed understanding. Rx sent to pharmacy. 

## 2018-02-02 NOTE — Telephone Encounter (Signed)
-----   Message from Birdie Sons, MD sent at 02/02/2018  2:26 PM EDT ----- There is a little bit of arthritis in toe joint, but otherwise normal. Need to start indomethacin 50mg  once very eight hours with food, #30, rf x 0 and continue colchicine 0.6 mg once a day. Do not add any OTC medications. Should have labs back by tomorrow.

## 2018-02-02 NOTE — Progress Notes (Signed)
       Patient: Paul Espinoza Male    DOB: Oct 16, 1961   57 y.o.   MRN: [id number] Visit Date: 02/02/2018  Today's Provider: [name], MD   Chief Complaint  Patient presents with  . Toe Pain    x 1 month   Subjective:    HPI Toe pain: Patient comes in reporting that he has a gout flare in the big toe of his left foot. Patients symptoms include pain and swelling of the toe. Patient has been taking Colchicine, but symptoms have worsened within the past few days. States had improved after about 5 days of colchicine, but then flared back up after he started running, and has been waxing and waning since then.  Has been alternating between colchicine and ibuprofen for several days at a time.     No Known Allergies   Current Outpatient Medications:  .  colchicine 0.6 MG tablet, Take two tablets on first day, then one daily, Disp: 30 tablet, Rfl: 0 .  fluticasone (FLONASE) 50 MCG/ACT nasal spray, Place 2 sprays into both nostrils daily., Disp: , Rfl:  .  hydrochlorothiazide (HYDRODIURIL) 25 MG tablet, Take 25 mg by mouth daily., Disp: , Rfl:  .  Multiple Vitamin (MULTIVITAMIN) capsule, Take 1 capsule by mouth daily., Disp: , Rfl:   Review of Systems  Constitutional: Negative for appetite change, chills and fever.  Respiratory: Negative for chest tightness, shortness of breath and wheezing.   Cardiovascular: Negative for chest pain and palpitations.  Gastrointestinal: Negative for abdominal pain, nausea and vomiting.  Musculoskeletal: Positive for arthralgias (big toe of left foot) and joint swelling (big toe of left foot).    Social History   Tobacco Use  . Smoking status: Never Smoker  . Smokeless tobacco: Never Used  Substance Use Topics  . Alcohol use: Yes    Comment: occasional   Objective:   BP 132/78 (BP Location: Left Arm, Patient Position: Sitting, Cuff Size: Large)   Pulse (!) 59   Temp 98 F (36.7 C) (Oral)   Resp 16   Wt 210 lb (95.3 kg)   SpO2 99%  Comment: room air  BMI 24.90 kg/m  There were no vitals filed for this visit.   Physical Exam  Physical Exam  General appearance: alert, well developed, well nourished, cooperative and in no distress Head: Normocephalic, without obvious abnormality, atraumatic Respiratory: Respirations even and unlabored, normal respiratory rate Extremities: tender area of faint erythema and mild swelling left lateral  MTP     Assessment & Plan:     1. Toe pain, chronic, left Initially improved with colchicine, but has since flared up and not improving with colchicine. May be related to repetitive stress. Need to rule out stress fracture.  - DG Toe Great Left; Future  2. Acute gout involving toe of left foot, unspecified cause Consider adding allopurinol, consider daily colchicine. May need follow up with podiatry if not responding to standard gout therapies.  - Uric acid  3. Lipid screening  - Lipid panel  4. Encounter for screening for metabolic disorder  - Comprehensive metabolic panel  5. Prostate cancer screening  - PSA       [name], MD  [facility]

## 2018-02-03 LAB — COMPREHENSIVE METABOLIC PANEL
A/G RATIO: 2.2 (ref 1.2–2.2)
ALT: 24 IU/L (ref 0–44)
AST: 15 IU/L (ref 0–40)
Albumin: 4.6 g/dL (ref 3.5–5.5)
Alkaline Phosphatase: 77 IU/L (ref 39–117)
BUN / CREAT RATIO: 15 (ref 9–20)
BUN: 15 mg/dL (ref 6–24)
Bilirubin Total: 0.3 mg/dL (ref 0.0–1.2)
CALCIUM: 9.5 mg/dL (ref 8.7–10.2)
CO2: 29 mmol/L (ref 20–29)
Chloride: 97 mmol/L (ref 96–106)
Creatinine, Ser: 1.02 mg/dL (ref 0.76–1.27)
GFR, EST AFRICAN AMERICAN: 94 mL/min/{1.73_m2} (ref >59)
GFR, EST NON AFRICAN AMERICAN: 81 mL/min/{1.73_m2} (ref >59)
GLOBULIN, TOTAL: 2.1 g/dL (ref 1.5–4.5)
Glucose: 112 mg/dL — ABNORMAL HIGH (ref 65–99)
POTASSIUM: 4.4 mmol/L (ref 3.5–5.2)
SODIUM: 140 mmol/L (ref 134–144)
Total Protein: 6.7 g/dL (ref 6.0–8.5)

## 2018-02-03 LAB — LIPID PANEL
CHOLESTEROL TOTAL: 161 mg/dL (ref 100–199)
Chol/HDL Ratio: 4.6 ratio (ref 0.0–5.0)
HDL: 35 mg/dL — ABNORMAL LOW (ref >39)
LDL Calculated: 89 mg/dL (ref 0–99)
TRIGLYCERIDES: 185 mg/dL — AB (ref 0–149)
VLDL CHOLESTEROL CAL: 37 mg/dL (ref 5–40)

## 2018-02-03 LAB — URIC ACID: URIC ACID: 7.2 mg/dL (ref 3.7–8.6)

## 2018-02-03 LAB — PSA: Prostate Specific Ag, Serum: 0.7 ng/mL (ref 0.0–4.0)

## 2018-02-09 ENCOUNTER — Encounter: Payer: Self-pay | Admitting: Family Medicine

## 2018-02-09 ENCOUNTER — Other Ambulatory Visit: Payer: Self-pay | Admitting: Family Medicine

## 2018-02-09 ENCOUNTER — Ambulatory Visit (INDEPENDENT_AMBULATORY_CARE_PROVIDER_SITE_OTHER): Payer: 59 | Admitting: Family Medicine

## 2018-02-09 VITALS — BP 110/64 | HR 80 | Temp 98.0°F | Resp 16 | Ht 77.0 in | Wt 211.0 lb

## 2018-02-09 DIAGNOSIS — Z0000 Encounter for general adult medical examination without abnormal findings: Secondary | ICD-10-CM | POA: Diagnosis not present

## 2018-02-09 DIAGNOSIS — M109 Gout, unspecified: Secondary | ICD-10-CM

## 2018-02-09 NOTE — Progress Notes (Signed)
Patient: Paul Espinoza, Male    DOB: 07/09/1961, 57 y.o.   MRN: [id number] Visit Date: 02/09/2018  Today's Provider: [name], MD   Chief Complaint  Patient presents with  . Annual Exam   Subjective:    Annual physical exam Paul Espinoza is a 57 y.o. male who presents today for health maintenance and complete physical. He feels well. He reports exercising yes/runs. He reports he is sleeping well.  ----------------------------------------------------------------- Acute gout involving toe of left foot, unspecified cause From 02/02/2018- labs ordered, showing uric acid levels high. Patient advised to start indomethacin and continue colchicine. He states pain has completely resolved since then and he declined allopurinol prescription. He did have all routine labs donw when uric acid levels were checked last week.   Results for orders placed or performed in visit on 02/02/18  PSA  Result Value Ref Range   Prostate Specific Ag, Serum 0.7 0.0 - 4.0 ng/mL  Comprehensive metabolic panel  Result Value Ref Range   Glucose 112 (H) 65 - 99 mg/dL   BUN 15 6 - 24 mg/dL   Creatinine, Ser 1.02 0.76 - 1.27 mg/dL   GFR calc non Af Amer 81 >59 mL/min/1.73   GFR calc Af Amer 94 >59 mL/min/1.73   BUN/Creatinine Ratio 15 9 - 20   Sodium 140 134 - 144 mmol/L   Potassium 4.4 3.5 - 5.2 mmol/L   Chloride 97 96 - 106 mmol/L   CO2 29 20 - 29 mmol/L   Calcium 9.5 8.7 - 10.2 mg/dL   Total Protein 6.7 6.0 - 8.5 g/dL   Albumin 4.6 3.5 - 5.5 g/dL   Globulin, Total 2.1 1.5 - 4.5 g/dL   Albumin/Globulin Ratio 2.2 1.2 - 2.2   Bilirubin Total 0.3 0.0 - 1.2 mg/dL   Alkaline Phosphatase 77 39 - 117 IU/L   AST 15 0 - 40 IU/L   ALT 24 0 - 44 IU/L  Uric acid  Result Value Ref Range   Uric Acid 7.2 3.7 - 8.6 mg/dL  Lipid panel  Result Value Ref Range   Cholesterol, Total 161 100 - 199 mg/dL   Triglycerides 185 (H) 0 - 149 mg/dL   HDL 35 (L) >39 mg/dL   VLDL Cholesterol Cal 37 5 - 40  mg/dL   LDL Calculated 89 0 - 99 mg/dL   Chol/HDL Ratio 4.6 0.0 - 5.0 ratio    Review of Systems  Constitutional: Negative for chills, diaphoresis and fever.  HENT: Positive for hearing loss and tinnitus. Negative for congestion, ear discharge, ear pain, nosebleeds and sore throat.   Eyes: Positive for visual disturbance. Negative for photophobia, pain, discharge and redness.  Respiratory: Negative for cough, shortness of breath, wheezing and stridor.   Cardiovascular: Negative for chest pain, palpitations and leg swelling.  Gastrointestinal: Negative for abdominal pain, blood in stool, constipation, diarrhea, nausea and vomiting.  Endocrine: Negative for polydipsia.  Genitourinary: Negative for dysuria, flank pain, frequency, hematuria and urgency.  Musculoskeletal: Positive for joint swelling. Negative for back pain, myalgias and neck pain.  Skin: Negative for rash.  Allergic/Immunologic: Negative for environmental allergies.  Neurological: Negative for dizziness, tremors, seizures, weakness and headaches.  Hematological: Does not bruise/bleed easily.  Psychiatric/Behavioral: Negative for hallucinations and suicidal ideas. The patient is not nervous/anxious.   All other systems reviewed and are negative.   Social History      He  reports that he has never smoked. He has never used smokeless  tobacco. He reports that he drinks alcohol. He reports that he does not use drugs.       Social History   Socioeconomic History  . Marital status: Married    Spouse name: Not on file  . Number of children: Not on file  . Years of education: Not on file  . Highest education level: Not on file  Occupational History    Comment: [employer]  Social Needs  . Financial resource strain: Not on file  . Food insecurity:    Worry: Not on file    Inability: Not on file  . Transportation needs:    Medical: Not on file    Non-medical: Not on file  Tobacco Use  . Smoking  status: Never Smoker  . Smokeless tobacco: Never Used  Substance and Sexual Activity  . Alcohol use: Yes    Comment: occasional  . Drug use: Never  . Sexual activity: Not on file  Lifestyle  . Physical activity:    Days per week: Not on file    Minutes per session: Not on file  . Stress: Not on file  Relationships  . Social connections:    Talks on phone: Not on file    Gets together: Not on file    Attends religious service: Not on file    Active member of club or organization: Not on file    Attends meetings of clubs or organizations: Not on file    Relationship status: Not on file  Other Topics Concern  . Not on file  Social History Narrative  . Not on file    Past Medical History:  Diagnosis Date  . History of chicken pox   . Vertigo      Patient Active Problem List   Diagnosis Date Noted  . Dizziness 01/09/2016  . Palpitations 01/09/2016  . Meniere disease 12/15/2015  . History of adenomatous polyp of colon 10/10/2015  . Family history of cancer of digestive system 04/24/2010    Past Surgical History:  Procedure Laterality Date  . COLONOSCOPY WITH PROPOFOL N/A 10/09/2015   Procedure: COLONOSCOPY WITH PROPOFOL;  Surgeon: Hulen Luster, MD;  Location: [facility] ENDOSCOPY;  Service: Gastroenterology;  Laterality: N/A;    Family History        Family Status  Relation Name Status  . Mother  Deceased  . Father  Deceased at age 1  . Brother  Alive  . Brother  Alive  . Brother  Alive        His family history includes Breast cancer in his mother; Colon cancer in his brother and father; Colon polyps in his father and mother; Heart disease in his father and mother.      No Known Allergies   Current Outpatient Medications:  .  colchicine 0.6 MG tablet, TAKE TWO TABLETS BY MOUTH ON FIRST DAY, THEN TAKE ONE TABLET BY MOUTH DAILY, Disp: 30 tablet, Rfl: 2 .  fluticasone (FLONASE) 50 MCG/ACT nasal spray, Place 2 sprays into both nostrils daily., Disp: , Rfl:  .   hydrochlorothiazide (HYDRODIURIL) 25 MG tablet, Take 25 mg by mouth daily., Disp: , Rfl:  .  indomethacin (INDOCIN) 50 MG capsule, Take 1 capsule (50 mg total) by mouth every 8 (eight) hours. With food, Disp: 30 capsule, Rfl: 0 .  Multiple Vitamin (MULTIVITAMIN) capsule, Take 1 capsule by mouth daily., Disp: , Rfl:    Patient Care Team: Birdie Sons, MD as PCP - General (Family Medicine) Ralene Bathe,  MD (Dermatology) Carloyn Manner, MD as Referring Physician (Otolaryngology)      Objective:   Vitals: BP 110/64 (BP Location: Right Arm, Patient Position: Sitting, Cuff Size: Large)   Pulse 80   Temp 98 F (36.7 C) (Oral)   Resp 16   Ht 6\' 5"  (1.956 m)   Wt 211 lb (95.7 kg)   SpO2 96%   BMI 25.02 kg/m    Vitals:   02/09/18 0904  BP: 110/64  Pulse: 80  Resp: 16  Temp: 98 F (36.7 C)  TempSrc: Oral  SpO2: 96%  Weight: 211 lb (95.7 kg)  Height: 6\' 5"  (1.956 m)     Physical Exam   General Appearance:    Alert, cooperative, no distress, appears stated age  Head:    Normocephalic, without obvious abnormality, atraumatic  Eyes:    PERRL, conjunctiva/corneas clear, EOM's intact, fundi    benign, both eyes       Ears:    Normal TM's and external ear canals, both ears  Nose:   Nares normal, septum midline, mucosa normal, no drainage   or sinus tenderness  Throat:   Lips, mucosa, and tongue normal; teeth and gums normal  Neck:   Supple, symmetrical, trachea midline, no adenopathy;       thyroid:  No enlargement/tenderness/nodules; no carotid   bruit or JVD  Back:     Symmetric, no curvature, ROM normal, no CVA tenderness  Lungs:     Clear to auscultation bilaterally, respirations unlabored  Chest wall:    No tenderness or deformity  Heart:    Regular rate and rhythm, S1 and S2 normal, no murmur, rub   or gallop  Abdomen:     Soft, non-tender, bowel sounds active all four quadrants,    no masses, no organomegaly  Genitalia:    deferred  Rectal:    deferred    Extremities:   Extremities normal, atraumatic, no cyanosis or edema  Pulses:   2+ and symmetric all extremities  Skin:   Skin color, texture, turgor normal, no rashes or lesions  Lymph nodes:   Cervical, supraclavicular, and axillary nodes normal  Neurologic:   CNII-XII intact. Normal strength, sensation and reflexes      throughout    Depression Screen PHQ 2/9 Scores 02/09/2018 02/09/2018 01/09/2017 01/09/2016  PHQ - 2 Score 0 0 0 0  PHQ- 9 Score 0 0 1 0      Assessment & Plan:     Routine Health Maintenance and Physical Exam  Exercise Activities and Dietary recommendations Goals    None      Immunization History  Administered Date(s) Administered  . Tdap 04/24/2010    Health Maintenance  Topic Date Due  . HIV Screening  01/10/2027 (Originally 05/25/1975)  . INFLUENZA VACCINE  06/04/2018  . TETANUS/TDAP  04/24/2020  . COLONOSCOPY  10/08/2020  . Hepatitis C Screening  Completed     Discussed health benefits of physical activity, and encouraged him to engage in regular exercise appropriate for his age and condition.    --------------------------------------------------------------------  1. Annual physical exam Unremarkable exam, normal labs. Counseled regarding Shingrix.   2. Acute gout involving toe of left foot, unspecified cause Resolved on combination colchicine and indomethacin which he tolerated without adverse effect. He declined allopurinol for now since he usually only has one gout flare a year.     [name], MD  [facility]

## 2018-02-09 NOTE — Patient Instructions (Addendum)
 The CDC recommends two doses of Shingrix (the shingles vaccine) separated by 2 to 6 months for adults age 58 years and older. I recommend checking with your insurance plan regarding coverage for this vaccine.     Preventive Care 40-64 Years, Male Preventive care refers to lifestyle choices and visits with your health care provider that can promote health and wellness. What does preventive care include?  A yearly physical exam. This is also called an annual well check.  Dental exams once or twice a year.  Routine eye exams. Ask your health care provider how often you should have your eyes checked.  Personal lifestyle choices, including: ? Daily care of your teeth and gums. ? Regular physical activity. ? Eating a healthy diet. ? Avoiding tobacco and drug use. ? Limiting alcohol use. ? Practicing safe sex. ? Taking low-dose aspirin every day starting at age 58. What happens during an annual well check? The services and screenings done by your health care provider during your annual well check will depend on your age, overall health, lifestyle risk factors, and family history of disease. Counseling Your health care provider may ask you questions about your:  Alcohol use.  Tobacco use.  Drug use.  Emotional well-being.  Home and relationship well-being.  Sexual activity.  Eating habits.  Work and work environment.  Screening You may have the following tests or measurements:  Height, weight, and BMI.  Blood pressure.  Lipid and cholesterol levels. These may be checked every 5 years, or more frequently if you are over 58 years old.  Skin check.  Lung cancer screening. You may have this screening every year starting at age 55 if you have a 30-pack-year history of smoking and currently smoke or have quit within the past 15 years.  Fecal occult blood test (FOBT) of the stool. You may have this test every year starting at age 58.  Flexible sigmoidoscopy or  colonoscopy. You may have a sigmoidoscopy every 5 years or a colonoscopy every 10 years starting at age 58.  Prostate cancer screening. Recommendations will vary depending on your family history and other risks.  Hepatitis C blood test.  Hepatitis B blood test.  Sexually transmitted disease (STD) testing.  Diabetes screening. This is done by checking your blood sugar (glucose) after you have not eaten for a while (fasting). You may have this done every 1-3 years.  Discuss your test results, treatment options, and if necessary, the need for more tests with your health care provider. Vaccines Your health care provider may recommend certain vaccines, such as:  Influenza vaccine. This is recommended every year.  Tetanus, diphtheria, and acellular pertussis (Tdap, Td) vaccine. You may need a Td booster every 10 years.  Varicella vaccine. You may need this if you have not been vaccinated.  Zoster vaccine. You may need this after age 60.  Measles, mumps, and rubella (MMR) vaccine. You may need at least one dose of MMR if you were born in 1957 or later. You may also need a second dose.  Pneumococcal 13-valent conjugate (PCV13) vaccine. You may need this if you have certain conditions and have not been vaccinated.  Pneumococcal polysaccharide (PPSV23) vaccine. You may need one or two doses if you smoke cigarettes or if you have certain conditions.  Meningococcal vaccine. You may need this if you have certain conditions.  Hepatitis A vaccine. You may need this if you have certain conditions or if you travel or work in places where you may be   exposed to hepatitis A.  Hepatitis B vaccine. You may need this if you have certain conditions or if you travel or work in places where you may be exposed to hepatitis B.  Haemophilus influenzae type b (Hib) vaccine. You may need this if you have certain risk factors.  Talk to your health care provider about which screenings and vaccines you need and  how often you need them. This information is not intended to replace advice given to you by your health care provider. Make sure you discuss any questions you have with your health care provider. Document Released: 11/17/2015 Document Revised: 07/10/2016 Document Reviewed: 08/22/2015 Elsevier Interactive Patient Education  [employer].

## 2018-02-13 ENCOUNTER — Other Ambulatory Visit: Payer: Self-pay | Admitting: Family Medicine

## 2018-02-13 MED ORDER — ALLOPURINOL 100 MG PO TABS: 100.0000 mg | ORAL_TABLET | Freq: Every day | ORAL | 6 refills | Status: DC

## 2018-02-13 MED ORDER — INDOMETHACIN 50 MG PO CAPS: 50.0000 mg | ORAL_CAPSULE | Freq: Three times a day (TID) | ORAL | 1 refills | Status: DC | PRN

## 2018-02-13 NOTE — Telephone Encounter (Signed)
1. Pt stated at [facility] on 02/09/18 Dr. Caryn Section discussed pt trying Allopurinol to try to lower his uric acid and hopefully help with gout. Pt stated he declined at the time but since then his gout has flared up again and would like to try the Allopurinol. Pt is requesting it to be sent to [facility].    2. Pt contacted office for refill request on the following medications:  indomethacin (INDOCIN) 50 MG capsule  Paul Espinoza  Last Rx: 02/02/18 LOV: 02/09/18  Please advise. Thanks TNP

## 2018-02-13 NOTE — Telephone Encounter (Signed)
Please review. Thanks!  

## 2018-04-14 ENCOUNTER — Other Ambulatory Visit: Payer: Self-pay | Admitting: Family Medicine

## 2018-04-14 DIAGNOSIS — M109 Gout, unspecified: Secondary | ICD-10-CM

## 2018-04-22 ENCOUNTER — Other Ambulatory Visit: Payer: Self-pay | Admitting: Family Medicine

## 2018-04-22 MED ORDER — INDOMETHACIN 50 MG PO CAPS: 50.0000 mg | ORAL_CAPSULE | Freq: Three times a day (TID) | ORAL | 5 refills | Status: DC | PRN

## 2018-04-22 NOTE — Telephone Encounter (Signed)
Pt contacted office for refill request on the following medications:  indomethacin (INDOCIN) 50 MG capsule  Paul Espinoza  Last Rx: 02/13/18 LOV: 02/09/18 Please advise. Thanks TNP

## 2018-08-09 ENCOUNTER — Other Ambulatory Visit: Payer: Self-pay | Admitting: Family Medicine

## 2018-08-09 DIAGNOSIS — M109 Gout, unspecified: Secondary | ICD-10-CM

## 2018-09-08 ENCOUNTER — Other Ambulatory Visit: Payer: Self-pay | Admitting: Family Medicine

## 2018-11-10 ENCOUNTER — Ambulatory Visit: Payer: 59 | Admitting: Family Medicine

## 2018-11-10 ENCOUNTER — Encounter: Payer: Self-pay | Admitting: Family Medicine

## 2018-11-10 VITALS — BP 140/80 | HR 65 | Temp 99.1°F | Resp 16 | Wt 210.0 lb

## 2018-11-10 DIAGNOSIS — J069 Acute upper respiratory infection, unspecified: Secondary | ICD-10-CM | POA: Diagnosis not present

## 2018-11-10 DIAGNOSIS — R05 Cough: Secondary | ICD-10-CM | POA: Diagnosis not present

## 2018-11-10 DIAGNOSIS — R059 Cough, unspecified: Secondary | ICD-10-CM

## 2018-11-10 MED ORDER — HYDROCODONE-HOMATROPINE 5-1.5 MG/5ML PO SYRP: 5.0000 mL | ORAL_SOLUTION | Freq: Four times a day (QID) | ORAL | 0 refills | Status: DC | PRN

## 2018-11-10 MED ORDER — AZITHROMYCIN 250 MG PO TABS: ORAL_TABLET | ORAL | 0 refills | Status: AC

## 2018-11-10 NOTE — Patient Instructions (Signed)
.   Please bring all of your medications to every appointment so we can make sure that our medication list is the same as yours.   

## 2018-11-10 NOTE — Progress Notes (Signed)
Patient: Paul Espinoza Male    DOB: November 04, 1961   58 y.o.   MRN: [id number] Visit Date: 11/10/2018  Today's Provider: [name], MD   Chief Complaint  Patient presents with  . Cough    x 10 days   Subjective:     Cough  This is a new problem. Episode onset: 10 days ago. The problem has been gradually worsening. The cough is non-productive. Associated symptoms include ear congestion (left ear), headaches, postnasal drip, rhinorrhea and wheezing. Pertinent negatives include no chest pain, chills, ear pain, fever, heartburn, hemoptysis, myalgias, nasal congestion, sore throat, shortness of breath or sweats. Treatments tried: Sudafed and Flonase. The treatment provided moderate relief.  Has had some left over Hycodan prescribed last year which has worked well for cough.   No Known Allergies   Current Outpatient Medications:  .  allopurinol (ZYLOPRIM) 100 MG tablet, TAKE ONE TABLET BY MOUTH DAILY, Disp: 30 tablet, Rfl: 11 .  colchicine 0.6 MG tablet, TAKE TWO TABLETS BY MOUTH ON FIRST DAY, THEN TAKE ONE TABLET BY MOUTH DAILY, Disp: 30 tablet, Rfl: 11 .  fluticasone (FLONASE) 50 MCG/ACT nasal spray, Place 2 sprays into both nostrils daily., Disp: , Rfl:  .  hydrochlorothiazide (HYDRODIURIL) 25 MG tablet, Take 25 mg by mouth daily., Disp: , Rfl:  .  indomethacin (INDOCIN) 50 MG capsule, Take 1 capsule (50 mg total) by mouth 3 (three) times daily as needed (for gout flares). With food, Disp: 30 capsule, Rfl: 5 .  Multiple Vitamin (MULTIVITAMIN) capsule, Take 1 capsule by mouth daily., Disp: , Rfl:   Review of Systems  Constitutional: Positive for fatigue. Negative for appetite change, chills and fever.  HENT: Positive for postnasal drip and rhinorrhea. Negative for ear pain and sore throat.   Respiratory: Positive for cough and wheezing. Negative for hemoptysis, chest tightness and shortness of breath.   Cardiovascular: Negative for chest pain and palpitations.    Gastrointestinal: Negative for abdominal pain, heartburn, nausea and vomiting.  Musculoskeletal: Negative for myalgias.  Neurological: Positive for headaches.    Social History   Tobacco Use  . Smoking status: Never Smoker  . Smokeless tobacco: Never Used  Substance Use Topics  . Alcohol use: Yes    Comment: occasional      Objective:   BP 140/80 (BP Location: Right Arm, Cuff Size: Large)   Pulse 65   Temp 99.1 F (37.3 C) (Oral)   Resp 16   Wt 210 lb (95.3 kg)   SpO2 97% Comment: room air  BMI 24.90 kg/m  Vitals:   11/10/18 0844 11/10/18 0850  BP: (!) 142/76 140/80  Pulse: 65   Resp: 16   Temp: 99.1 F (37.3 C)   TempSrc: Oral   SpO2: 97%   Weight: 210 lb (95.3 kg)      Physical Exam  General Appearance:    Alert, cooperative, no distress  HENT:   bilateral TM normal without fluid or infection, neck without nodes, throat normal without erythema or exudate and nasal mucosa pale and congested  Eyes:    PERRL, conjunctiva/corneas clear, EOM's intact       Lungs:     Rare expiratory wheeze, no rales., respirations unlabored  Heart:    Regular rate and rhythm  Neurologic:   Awake, alert, oriented x 3. No apparent focal neurological           defect.           Assessment &  Plan    1. Cough  - HYDROcodone-homatropine (HYCODAN) 5-1.5 MG/5ML syrup; Take 5 mLs by mouth every 6 (six) hours as needed for cough.  Dispense: 120 mL; Refill: 0  2. Upper respiratory tract infection, unspecified type Sx have persistent now over 10 days, will cover for bronchitis with - azithromycin (ZITHROMAX) 250 MG tablet; 2 by mouth today, then 1 daily for 4 days  Dispense: 6 tablet; Refill: 0     [name], MD  [facility]

## 2019-01-16 IMAGING — CR DG TOE GREAT 2+V*L*
1 series · 2 of 2 positions shown · non-contrast
Comparison: None.

CLINICAL DATA: History of gout with first MTP joint pain

EXAM:
LEFT GREAT TOE

[Series 1: dg toe great left · 0.14mm/px · 2 of 2 slices shown]
[im 1/2]
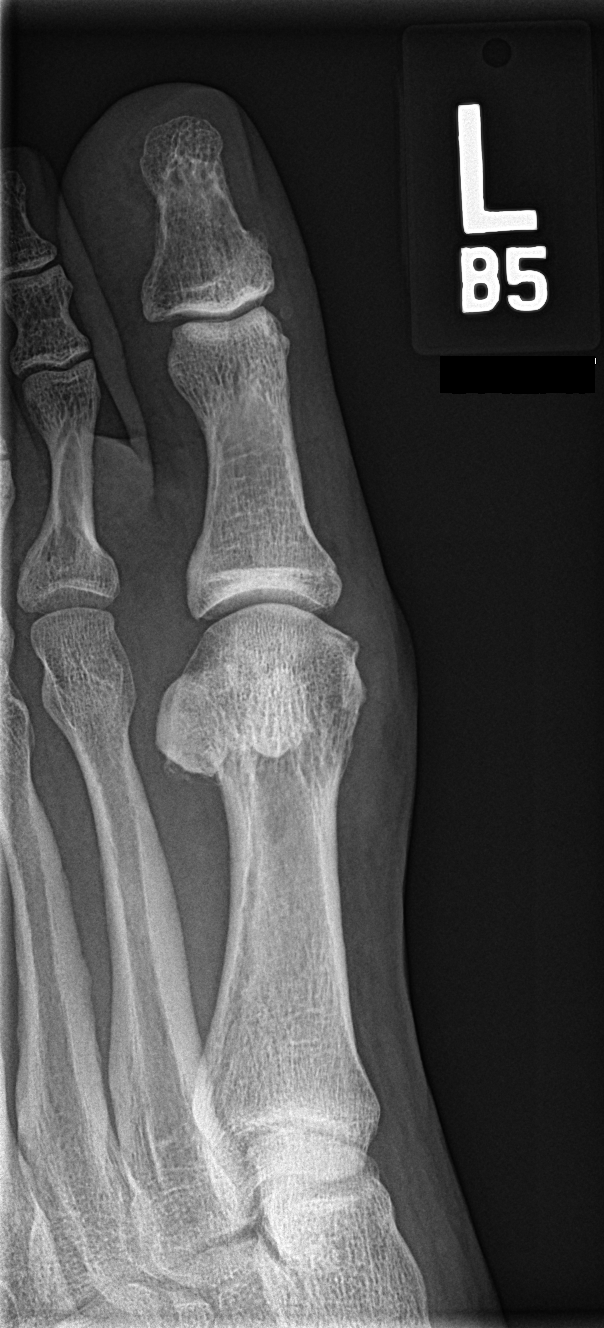
[im 2/2]
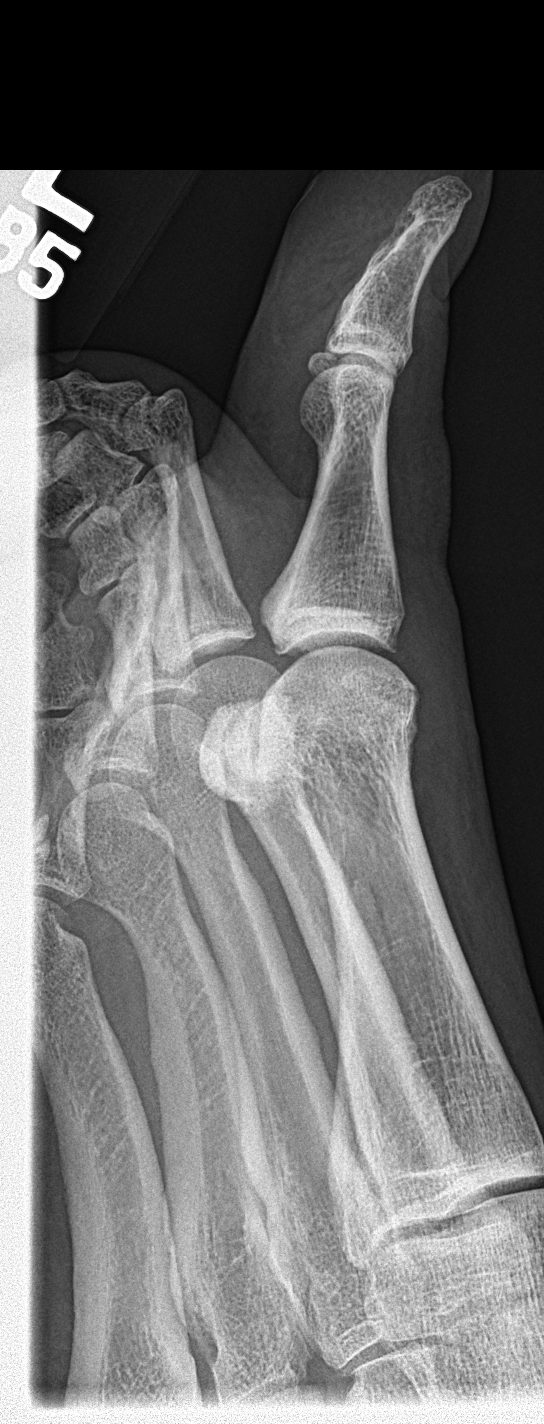

[2 of 2 positions shown; findings below may reference images not displayed]

FINDINGS: No acute fracture or dislocation is noted. Mild degenerative changes
are noted in the first MTP joint. No soft tissue abnormality is
seen. No definitive gouty tophi are noted.
IMPRESSION: Mild degenerative changes in the first MTP joint. No other focal
abnormality is seen.

## 2019-02-11 ENCOUNTER — Encounter: Payer: 59 | Admitting: Family Medicine

## 2019-04-16 ENCOUNTER — Other Ambulatory Visit: Payer: Self-pay

## 2019-04-16 ENCOUNTER — Ambulatory Visit (INDEPENDENT_AMBULATORY_CARE_PROVIDER_SITE_OTHER): Payer: 59 | Admitting: Family Medicine

## 2019-04-16 ENCOUNTER — Encounter: Payer: Self-pay | Admitting: Family Medicine

## 2019-04-16 VITALS — BP 130/71 | HR 54 | Temp 97.7°F | Resp 16 | Ht 77.0 in | Wt 210.0 lb

## 2019-04-16 DIAGNOSIS — Z0000 Encounter for general adult medical examination without abnormal findings: Secondary | ICD-10-CM

## 2019-04-16 DIAGNOSIS — E790 Hyperuricemia without signs of inflammatory arthritis and tophaceous disease: Secondary | ICD-10-CM

## 2019-04-16 DIAGNOSIS — Z125 Encounter for screening for malignant neoplasm of prostate: Secondary | ICD-10-CM | POA: Diagnosis not present

## 2019-04-16 DIAGNOSIS — G43109 Migraine with aura, not intractable, without status migrainosus: Secondary | ICD-10-CM | POA: Diagnosis not present

## 2019-04-16 NOTE — Patient Instructions (Addendum)
.   Please review the attached list of medications and notify my office if there are any errors.   The CDC recommends two doses of Shingrix (the shingles vaccine) separated by 2 to 6 months for adults age 59 years and older. I recommend checking with your insurance plan regarding coverage for this vaccine.    Limit alcohol consumption to nor more than 3 servings in a day and 14 in a week

## 2019-04-16 NOTE — Progress Notes (Signed)
Patient: Paul Espinoza, Male    DOB: 1961/02/12, 58 y.o.   MRN: [id number] Visit Date: 04/16/2019  Today's Provider: [name], MD   Chief Complaint  Patient presents with  . Annual Exam  . Gout   Subjective:     Annual physical exam Paul Espinoza is a 58 y.o. male who presents today for health maintenance and complete physical. He feels well. He reports exercising reg He reports he is sleeping well.  Still has occasional gout flares which responds well to colchicine and indomethacin. Is still taking allopurinol. States no major flares since starting allopurinol.  -----------------------------------------------------------------   Review of Systems  Constitutional: Negative.   HENT: Positive for congestion and tinnitus. Negative for dental problem, drooling, ear discharge, ear pain, facial swelling, hearing loss, mouth sores, nosebleeds, postnasal drip, rhinorrhea, sinus pressure, sinus pain, sneezing, sore throat, trouble swallowing and voice change.   Eyes: Negative.   Respiratory: Negative.   Cardiovascular: Negative.   Gastrointestinal: Negative.   Endocrine: Negative.   Genitourinary: Negative.   Musculoskeletal: Negative.   Skin: Negative.   Allergic/Immunologic: Positive for environmental allergies. Negative for food allergies and immunocompromised state.  Neurological: Negative.   Hematological: Negative.   Psychiatric/Behavioral: Negative.     Social History      He  reports that he has never smoked. He has never used smokeless tobacco. He reports current alcohol use. He reports that he does not use drugs.       Social History   Socioeconomic History  . Marital status: Married    Spouse name: Not on file  . Number of children: Not on file  . Years of education: Not on file  . Highest education level: Not on file  Occupational History    Comment: [employer]  Social Needs  . Financial resource strain: Not on  file  . Food insecurity    Worry: Not on file    Inability: Not on file  . Transportation needs    Medical: Not on file    Non-medical: Not on file  Tobacco Use  . Smoking status: Never Smoker  . Smokeless tobacco: Never Used  Substance and Sexual Activity  . Alcohol use: Yes    Comment: occasional  . Drug use: Never  . Sexual activity: Not on file  Lifestyle  . Physical activity    Days per week: Not on file    Minutes per session: Not on file  . Stress: Not on file  Relationships  . Social Herbalist on phone: Not on file    Gets together: Not on file    Attends religious service: Not on file    Active member of club or organization: Not on file    Attends meetings of clubs or organizations: Not on file    Relationship status: Not on file  Other Topics Concern  . Not on file  Social History Narrative  . Not on file    Past Medical History:  Diagnosis Date  . History of chicken pox   . Vertigo      Patient Active Problem List   Diagnosis Date Noted  . Dizziness 01/09/2016  . Palpitations 01/09/2016  . Meniere disease 12/15/2015  . History of adenomatous polyp of colon 10/10/2015  . Family history of cancer of digestive system 04/24/2010    Past Surgical History:  Procedure Laterality Date  . COLONOSCOPY WITH PROPOFOL N/A 10/09/2015   Procedure: COLONOSCOPY  WITH PROPOFOL;  Surgeon: Hulen Luster, MD;  [facility] END[facility]COPY;  Service: Gastroenterology;  Laterality: N/A;    Family History        Family Status  Relation Name Status  . Mother  Deceased  . Father  Deceased at age 69  . Brother  Alive  . Brother  Alive  . Brother  Alive        His family history includes Breast cancer in his mother; Colon cancer in his brother and father; Colon polyps in his father and mother; Heart disease in his father and mother.      No Known Allergies   Current Outpatient Medications:  .  allopurinol (ZYLOPRIM) 100 MG tablet, TAKE ONE TABLET BY MOUTH  DAILY, Disp: 30 tablet, Rfl: 11 .  colchicine 0.6 MG tablet, TAKE TWO TABLETS BY MOUTH ON FIRST DAY, THEN TAKE ONE TABLET BY MOUTH DAILY, Disp: 30 tablet, Rfl: 11 .  fluticasone (FLONASE) 50 MCG/ACT nasal spray, Place 2 sprays into both nostrils daily., Disp: , Rfl:  .  hydrochlorothiazide (HYDRODIURIL) 25 MG tablet, Take 25 mg by mouth daily., Disp: , Rfl:  .  indomethacin (INDOCIN) 50 MG capsule, Take 1 capsule (50 mg total) by mouth 3 (three) times daily as needed (for gout flares). With food, Disp: 30 capsule, Rfl: 5 .  Multiple Vitamin (MULTIVITAMIN) capsule, Take 1 capsule by mouth daily., Disp: , Rfl:  .  HYDROcodone-homatropine (HYCODAN) 5-1.5 MG/5ML syrup, Take 5 mLs by mouth every 6 (six) hours as needed for cough., Disp: 120 mL, Rfl: 0   Patient Care Team: Birdie Sons, MD as PCP - General (Family Medicine) Ralene Bathe, MD (Dermatology) Carloyn Manner, MD as Referring Physician (Otolaryngology)    Objective:    Vitals: BP 130/71 (BP Location: Right Arm, Patient Position: Sitting, Cuff Size: Normal)   Pulse (!) 54   Temp 97.7 F (36.5 C) (Oral)   Resp 16   Ht 6\' 5"  (1.956 m)   Wt 210 lb (95.3 kg)   BMI 24.90 kg/m    Vitals:   04/16/19 0902  BP: 130/71  Pulse: (!) 54  Resp: 16  Temp: 97.7 F (36.5 C)  TempSrc: Oral  Weight: 210 lb (95.3 kg)  Height: 6\' 5"  (1.956 m)     Physical Exam   General Appearance:    Alert, cooperative, no distress, appears stated age  Head:    Normocephalic, without obvious abnormality, atraumatic  Eyes:    PERRL, conjunctiva/corneas clear, EOM's intact, fundi    benign, both eyes       Ears:    Normal TM's and external ear canals, both ears  Nose:   Nares normal, septum midline, mucosa normal, no drainage   or sinus tenderness  Throat:   Lips, mucosa, and tongue normal; teeth and gums normal  Neck:   Supple, symmetrical, trachea midline, no adenopathy;       thyroid:  No enlargement/tenderness/nodules; no carotid    bruit or JVD  Back:     Symmetric, no curvature, ROM normal, no CVA tenderness  Lungs:     Clear to auscultation bilaterally, respirations unlabored  Chest wall:    No tenderness or deformity  Heart:    Regular rate and rhythm, S1 and S2 normal, no murmur, rub   or gallop  Abdomen:     Soft, non-tender, bowel sounds active all four quadrants,    no masses, no organomegaly  Genitalia:    deferred  Rectal:  deferred  Extremities:   Extremities normal, atraumatic, no cyanosis or edema  Pulses:   2+ and symmetric all extremities  Skin:   Skin color, texture, turgor normal, no rashes or lesions  Lymph nodes:   Cervical, supraclavicular, and axillary nodes normal  Neurologic:   CNII-XII intact. Normal strength, sensation and reflexes      throughout    Depression Screen PHQ 2/9 Scores 04/16/2019 02/09/2018 02/09/2018 01/09/2017  PHQ - 2 Score 0 0 0 0  PHQ- 9 Score 0 0 0 1       Assessment & Plan:     Routine Health Maintenance and Physical Exam  Exercise Activities and Dietary recommendations Goals   None     Immunization History  Administered Date(s) Administered  . Tdap 04/24/2010    Health Maintenance  Topic Date Due  . HIV Screening  01/10/2027 (Originally 05/25/1975)  . INFLUENZA VACCINE  06/05/2019  . TETANUS/TDAP  04/24/2020  . COLONOSCOPY  10/08/2020  . Hepatitis C Screening  Completed     Discussed health benefits of physical activity, and encouraged him to engage in regular exercise appropriate for his age and condition.    --------------------------------------------------------------------  1. Annual physical exam Doing well.  - Comprehensive metabolic panel - Lipid panel - CBC  2. Prostate cancer screening  - PSA  3. Hyperuricemia Gout flares much better since starting allopurinol, although still has a few minor flares periodically.  - Uric acid  4. Ocular migraine Reports he is able to manage these with minimal disruption to ADLs.    [name], MD  [facility]

## 2019-04-17 LAB — LIPID PANEL
Chol/HDL Ratio: 4.4 ratio (ref 0.0–5.0)
Cholesterol, Total: 160 mg/dL (ref 100–199)
HDL: 36 mg/dL — ABNORMAL LOW (ref >39)
LDL Calculated: 84 mg/dL (ref 0–99)
Triglycerides: 199 mg/dL — ABNORMAL HIGH (ref 0–149)
VLDL Cholesterol Cal: 40 mg/dL (ref 5–40)

## 2019-04-17 LAB — COMPREHENSIVE METABOLIC PANEL
ALT: 31 IU/L (ref 0–44)
AST: 21 IU/L (ref 0–40)
Albumin/Globulin Ratio: 1.9 (ref 1.2–2.2)
Albumin: 4.3 g/dL (ref 3.8–4.9)
Alkaline Phosphatase: 73 IU/L (ref 39–117)
BUN/Creatinine Ratio: 15 (ref 9–20)
BUN: 16 mg/dL (ref 6–24)
Bilirubin Total: 0.4 mg/dL (ref 0.0–1.2)
CO2: 28 mmol/L (ref 20–29)
Calcium: 9.1 mg/dL (ref 8.7–10.2)
Chloride: 98 mmol/L (ref 96–106)
Creatinine, Ser: 1.04 mg/dL (ref 0.76–1.27)
GFR calc Af Amer: 91 mL/min/{1.73_m2} (ref >59)
GFR calc non Af Amer: 79 mL/min/{1.73_m2} (ref >59)
Globulin, Total: 2.3 g/dL (ref 1.5–4.5)
Glucose: 113 mg/dL — ABNORMAL HIGH (ref 65–99)
Potassium: 3.8 mmol/L (ref 3.5–5.2)
Sodium: 140 mmol/L (ref 134–144)
Total Protein: 6.6 g/dL (ref 6.0–8.5)

## 2019-04-17 LAB — CBC
Hematocrit: 43.8 % (ref 37.5–51.0)
Hemoglobin: 15.1 g/dL (ref 13.0–17.7)
MCH: 31.6 pg (ref 26.6–33.0)
MCHC: 34.5 g/dL (ref 31.5–35.7)
MCV: 92 fL (ref 79–97)
Platelets: 165 10*3/uL (ref 150–450)
RBC: 4.78 x10E6/uL (ref 4.14–5.80)
RDW: 13.1 % (ref 11.6–15.4)
WBC: 6.5 10*3/uL (ref 3.4–10.8)

## 2019-04-17 LAB — URIC ACID: Uric Acid: 6.8 mg/dL (ref 3.7–8.6)

## 2019-04-17 LAB — PSA: Prostate Specific Ag, Serum: 0.6 ng/mL (ref 0.0–4.0)

## 2019-04-19 ENCOUNTER — Telehealth: Payer: Self-pay

## 2019-04-19 MED ORDER — ALLOPURINOL 100 MG PO TABS: 200.0000 mg | ORAL_TABLET | Freq: Every day | ORAL | 1 refills | Status: DC

## 2019-04-19 NOTE — Telephone Encounter (Signed)
Pt advised.  RX sent to [facility].  Thanks,   -Mickel Baas

## 2019-04-19 NOTE — Telephone Encounter (Signed)
-----   Message from Birdie Sons, MD sent at 04/19/2019  8:10 AM EDT ----- Cholesterol is good at 160. psa is normal. Uric acid is slightly high at 6.8, should ideally be below 6. Recommend he increase allopurinol to 2 tablets daily. Can send in new prescription for 60 tablets, or 180 if he wants a three month supply.

## 2019-04-26 ENCOUNTER — Other Ambulatory Visit: Payer: Self-pay | Admitting: Family Medicine

## 2019-11-01 ENCOUNTER — Other Ambulatory Visit: Payer: Self-pay | Admitting: Family Medicine

## 2019-11-09 ENCOUNTER — Other Ambulatory Visit: Payer: 59

## 2019-11-30 ENCOUNTER — Other Ambulatory Visit: Payer: Self-pay | Admitting: Family Medicine

## 2019-12-30 ENCOUNTER — Telehealth: Payer: Self-pay

## 2019-12-30 NOTE — Telephone Encounter (Signed)
Copied from [place] [phone]. Topic: General - Other >> Dec 30, 2019  3:28 PM Leward Quan A wrote: Reason for CRM: Beth with [facility] called to inquire of Dr Caryn Section if it would be ok with him for patient to start taking Dyazide 12.5 MG instead of taking the hydrochlorothiazide (HYDRODIURIL) 12.5 MG tablet which he is on currently for his Meniere disease. Asking for a call back with an answer at Ph# [phone] ext# [phone]

## 2019-12-31 NOTE — Telephone Encounter (Signed)
LM for Beth to return call. Okay for PEC to advise Beth of message below.

## 2019-12-31 NOTE — Telephone Encounter (Signed)
That's fine, there's no recent he couldn't take Dyazide.

## 2020-01-03 ENCOUNTER — Other Ambulatory Visit: Payer: Self-pay | Admitting: Family Medicine

## 2020-01-31 ENCOUNTER — Other Ambulatory Visit: Payer: Self-pay

## 2020-01-31 ENCOUNTER — Ambulatory Visit: Payer: 59 | Admitting: Dermatology

## 2020-01-31 DIAGNOSIS — C44320 Squamous cell carcinoma of skin of unspecified parts of face: Secondary | ICD-10-CM

## 2020-01-31 DIAGNOSIS — L578 Other skin changes due to chronic exposure to nonionizing radiation: Secondary | ICD-10-CM | POA: Diagnosis not present

## 2020-01-31 DIAGNOSIS — C44329 Squamous cell carcinoma of skin of other parts of face: Secondary | ICD-10-CM | POA: Diagnosis not present

## 2020-01-31 DIAGNOSIS — D489 Neoplasm of uncertain behavior, unspecified: Secondary | ICD-10-CM

## 2020-01-31 DIAGNOSIS — C4492 Squamous cell carcinoma of skin, unspecified: Secondary | ICD-10-CM

## 2020-01-31 HISTORY — DX: Squamous cell carcinoma of skin, unspecified: C44.92

## 2020-01-31 NOTE — Progress Notes (Signed)
   Follow-Up Visit   Subjective  Paul Espinoza is a 59 y.o. male who presents for the following: spot on cheek (Left cheek peeling spot that does not go away for past 2 months).     The following portions of the chart were reviewed this encounter and updated as appropriate:     Review of Systems: No other skin or systemic complaints.  Objective  Well appearing patient in no apparent distress; mood and affect are within normal limits.  A focused examination was performed including head, including the scalp, face, neck, nose, ears, eyelids, and lips. Relevant physical exam findings are noted in the Assessment and Plan.  Objective  Head - Anterior (Face), Left Breast: Diffuse scaly erythematous macules with underlying dyspigmentation.   Objective  Left Medial Cheek: Pink pearly papule  Assessment & Plan  Actinic skin damage (2) Head - Anterior (Face)  Photoprotection and SPF 30+ sunscreen  Neoplasm of uncertain behavior Left Medial Cheek  Epidermal / dermal shaving  Lesion length (cm):  0.8 Lesion width (cm):  0.8 Margin per side (cm):  0.2 Total excision diameter (cm):  1.2 Informed consent: discussed and consent obtained   Timeout: patient name, date of birth, surgical site, and procedure verified   Anesthesia: the lesion was anesthetized in a standard fashion   Anesthetic:  1% lidocaine w/ epinephrine 1-100,000 buffered w/ 8.4% NaHCO3 Instrument used: flexible razor blade   Outcome: patient tolerated procedure well   Post-procedure details: sterile dressing applied and wound care instructions given   Dressing type: petrolatum and pressure dressing    Destruction of lesion Complexity: extensive   Destruction method: electrodesiccation and curettage   Informed consent: discussed and consent obtained   Timeout:  patient name, date of birth, surgical site, and procedure verified Procedure prep:  Patient was prepped and draped in usual sterile fashion Prep type:   Isopropyl alcohol Anesthesia: the lesion was anesthetized in a standard fashion   Anesthetic:  1% lidocaine w/ epinephrine 1-100,000 buffered w/ 8.4% NaHCO3 Curettage performed in three different directions: Yes   Electrodesiccation performed over the curetted area: Yes   Final wound size (cm):  1.2 Hemostasis achieved with:  pressure, aluminum chloride and electrodesiccation Outcome: patient tolerated procedure well with no complications   Post-procedure details: sterile dressing applied and wound care instructions given   Dressing type: bandage and petrolatum    Specimen 1 - Surgical pathology Differential Diagnosis: R/O Bcc vs other Check Margins: No Pink pearly papule  Return for   as scheduled  , Biopsy follow up.   Marene Lenz, CMA, am acting as scribe for Sarina Ser, MD .

## 2020-01-31 NOTE — Patient Instructions (Signed)

## 2020-02-01 ENCOUNTER — Other Ambulatory Visit: Payer: Self-pay | Admitting: Family Medicine

## 2020-02-01 NOTE — Telephone Encounter (Signed)
Requested Prescriptions  Pending Prescriptions Disp Refills  . allopurinol (ZYLOPRIM) 100 MG tablet [Pharmacy Med Name: ALLOPURINOL 100 MG TABLET] 60 tablet 0    Sig: TAKE TWO TABLETS BY MOUTH DAILY     Endocrinology:  Gout Agents Passed - 02/01/2020  6:21 AM      Passed - Uric Acid in normal range and within 360 days    Uric Acid  Date Value Ref Range Status  04/16/2019 6.8 3.7 - 8.6 mg/dL Final    Comment:               Therapeutic target for gout patients: <6.0         Passed - Cr in normal range and within 360 days    Creatinine, Ser  Date Value Ref Range Status  04/16/2019 1.04 0.76 - 1.27 mg/dL Final         Passed - Valid encounter within last 12 months    Recent Outpatient Visits          9 months ago Annual physical exam   [facility] Birdie Sons, MD   1 year ago Cough   [facility] Birdie Sons, MD   1 year ago Annual physical exam   [facility] Birdie Sons, MD   1 year ago Toe pain, chronic, left   [facility] Birdie Sons, MD   2 years ago Acute gout involving toe of left foot, unspecified cause   [facility] Birdie Sons, MD

## 2020-02-02 ENCOUNTER — Telehealth: Payer: Self-pay

## 2020-02-02 NOTE — Telephone Encounter (Signed)
Patient informed of pathology results 

## 2020-02-24 ENCOUNTER — Telehealth: Payer: Self-pay | Admitting: Family Medicine

## 2020-02-24 NOTE — Telephone Encounter (Signed)
I don't think he is taking losartan

## 2020-02-24 NOTE — Telephone Encounter (Signed)
Called Paul Espinoza at [facility] and left a detailed message on her nurse voice message line.

## 2020-02-24 NOTE — Telephone Encounter (Signed)
Copied from [place] [phone]. Topic: General - Other >> Feb 24, 2020 10:44 AM Celene Kras wrote: Reason for CRM: [facility], calling and is requesting clearance for dyazide in place of the losartan. Please advise.

## 2020-02-24 NOTE — Telephone Encounter (Signed)
Paul Espinoza is calling and the pt is on hctz 12.5 mg . Pt is not on losartan. Paul Espinoza would like to know if they can change hctz 12.5 to dyazide. Please call sandy [phone] ext [phone] to leave detail message on her voicemail

## 2020-02-25 NOTE — Telephone Encounter (Signed)
That's fine

## 2020-02-25 NOTE — Telephone Encounter (Signed)
I called Paul Espinoza at [facility] and left a detailed message on her nurse voice message system .

## 2020-04-24 NOTE — Progress Notes (Signed)
Complete physical exam   Patient: Paul Espinoza   DOB: 1961-02-18   59 y.o. Male  MRN: [id number] Visit Date: 04/26/2020  Today's healthcare provider: [name], MD   Chief Complaint  Patient presents with  . Annual Exam   [employer] as a scribe for [name], MD.,have documented all relevant documentation on the behalf of [name], MD,as directed by  [name], MD while in the presence of [name], MD.  Subjective    Paul Espinoza is a 60 y.o. male who presents today for a complete physical exam.  He reports consuming a general diet. Home exercise routine includes running 4 days per week. He generally feels fairly well. He reports sleeping well. He does have additional problems to discuss today including left hip pain and thumb pain. HPI  Follow up for Hyperuricemia:  The patient was last seen for this 1 years ago. Changes made at last visit include increasing Allopurinol to 2 tablets daily. Lab Results  Component Value Date   LABURIC 6.8 04/16/2019    He has since only had one gout flare in one of his toes which responded well to colchicine and indomethcin He reports good compliance with treatment. He feels that condition is stable. He is not having side effects.       He is interested in lesinurad for uric acid.  -----------------------------------------------------------------------------------------  Continues to have trouble with Meniere's disease followed by Dr. Pryor Ochoa.   Has a little trouble in right hip when he runs.   Dr. Nehemiah Massed excised SCC on face in March.   Past Medical History:  Diagnosis Date  . History of chicken pox   . Vertigo    Past Surgical History:  Procedure Laterality Date  . COLONOSCOPY WITH PROPOFOL N/A 10/09/2015   Procedure: COLONOSCOPY WITH PROPOFOL;  Surgeon: Hulen Luster, MD;  Location: [facility] ENDOSCOPY;  Service: Gastroenterology;  Laterality: N/A;   Social History   Socioeconomic  History  . Marital status: Married    Spouse name: Not on file  . Number of children: Not on file  . Years of education: Not on file  . Highest education level: Not on file  Occupational History    Comment: [employer]  Tobacco Use  . Smoking status: Never Smoker  . Smokeless tobacco: Never Used  Vaping Use  . Vaping Use: Never used  Substance and Sexual Activity  . Alcohol use: Yes    Comment: occasional  . Drug use: Never  . Sexual activity: Not on file  Other Topics Concern  . Not on file  Social History Narrative  . Not on file   Social Determinants of Health   Financial Resource Strain:   . Difficulty of Paying Living Expenses:   Food Insecurity:   . Worried About Charity fundraiser in the Last Year:   . Arboriculturist in the Last Year:   Transportation Needs:   . Film/video editor (Medical):   Marland Kitchen Lack of Transportation (Non-Medical):   Physical Activity:   . Days of Exercise per Week:   . Minutes of Exercise per Session:   Stress:   . Feeling of Stress :   Social Connections:   . Frequency of Communication with Friends and Family:   . Frequency of Social Gatherings with Friends and Family:   . Attends Religious Services:   . Active Member of Clubs or Organizations:   . Attends Archivist Meetings:   .  Marital Status:   Intimate Partner Violence:   . Fear of Current or Ex-Partner:   . Emotionally Abused:   Marland Kitchen Physically Abused:   . Sexually Abused:    Family Status  Relation Name Status  . Mother  Deceased  . Father  Deceased at age 22  . Brother  Alive  . Brother  Alive  . Brother  Alive   Family History  Problem Relation Age of Onset  . Heart disease Mother   . Breast cancer Mother   . Colon polyps Mother   . Colon cancer Father   . Colon polyps Father   . Heart disease Father   . Colon cancer Brother    No Known Allergies  Patient Care Team: Birdie Sons, MD as PCP - General (Family  Medicine) Ralene Bathe, MD (Dermatology) Carloyn Manner, MD as Referring Physician (Otolaryngology)   Medications: Outpatient Medications Prior to Visit  Medication Sig  . allopurinol (ZYLOPRIM) 100 MG tablet TAKE TWO TABLETS BY MOUTH DAILY  . colchicine 0.6 MG tablet TAKE TWO TABLETS BY MOUTH ON FIRST DAY, THEN TAKE ONE TABLET BY MOUTH DAILY  . fluticasone (FLONASE) 50 MCG/ACT nasal spray Place 2 sprays into both nostrils daily.  . hydrochlorothiazide (HYDRODIURIL) 25 MG tablet Take 50 mg by mouth daily.   . indomethacin (INDOCIN) 50 MG capsule TAKE ONE CAPSULE BY MOUTH THREE TIMES A DAY WITH FOOD AS NEEDED FOR GOUT FLARES  . KLOR-CON M20 20 MEQ tablet Take 20 mEq by mouth daily.  . Multiple Vitamin (MULTIVITAMIN) capsule Take 1 capsule by mouth daily.  . predniSONE (DELTASONE) 10 MG tablet Take 10 mg by mouth once a week.    No facility-administered medications prior to visit.    Review of Systems  Constitutional: Negative for appetite change, chills, fatigue and fever.  HENT: Positive for hearing loss. Negative for congestion, ear pain, nosebleeds and trouble swallowing.   Eyes: Negative for pain and visual disturbance.  Respiratory: Positive for cough. Negative for chest tightness and shortness of breath.   Cardiovascular: Negative for chest pain, palpitations and leg swelling.  Gastrointestinal: Negative for abdominal pain, blood in stool, constipation, diarrhea, nausea and vomiting.  Endocrine: Negative for polydipsia, polyphagia and polyuria.  Genitourinary: Negative for dysuria and flank pain.  Musculoskeletal: Positive for arthralgias. Negative for back pain, joint swelling, myalgias and neck stiffness.  Skin: Negative for color change, rash and wound.  Neurological: Negative for dizziness, tremors, seizures, speech difficulty, weakness, light-headedness and headaches.  Psychiatric/Behavioral: Negative for behavioral problems, confusion, decreased concentration,  dysphoric mood and sleep disturbance. The patient is not nervous/anxious.   All other systems reviewed and are negative.     Objective    BP 122/75 (BP Location: Right Arm, Patient Position: Sitting, Cuff Size: Normal)   Pulse 68   Temp (!) 96.8 F (36 C) (Temporal)   Ht 6\' 5"  (1.956 m)   Wt 211 lb 9.6 oz (96 kg)   BMI 25.09 kg/m    Physical Exam  BP 122/75 (BP Location: Right Arm, Patient Position: Sitting, Cuff Size: Normal)   Pulse 68   Temp (!) 96.8 F (36 C) (Temporal)   Ht 6\' 5"  (1.956 m)   Wt 211 lb 9.6 oz (96 kg)   BMI 25.09 kg/m    General Appearance:     Well developed, well nourished male. Alert, cooperative, in no acute distress, appears stated age  Head:    Normocephalic, without obvious abnormality, atraumatic  Eyes:  PERRL, conjunctiva/corneas clear, EOM's intact, fundi    benign, both eyes       Ears:    Normal TM's and external ear canals, both ears  Neck:   Supple, symmetrical, trachea midline, no adenopathy;       thyroid:  No enlargement/tenderness/nodules; no carotid   bruit or JVD  Back:     Symmetric, no curvature, ROM normal, no CVA tenderness  Lungs:     Clear to auscultation bilaterally, respirations unlabored  Chest wall:    No tenderness or deformity  Heart:    Normal heart rate. Normal rhythm. No murmurs, rubs, or gallops.  S1 and S2 normal  Abdomen:     Soft, non-tender, bowel sounds active all four quadrants,    no masses, no organomegaly  Genitalia:    deferred  Rectal:    deferred  Extremities:   All extremities are intact. No cyanosis or edema  Pulses:   2+ and symmetric all extremities  Skin:   Skin color, texture, turgor normal, no rashes or lesions  Lymph nodes:   Cervical, supraclavicular, and axillary nodes normal  Neurologic:   CNII-XII intact. Normal strength, sensation and reflexes      throughout     Depression Screen  PHQ 2/9 Scores 04/26/2020 04/16/2019 02/09/2018  PHQ - 2 Score 0 0 0  PHQ- 9 Score - 0 0    No  results found for any visits on 04/26/20.  Assessment & Plan    Routine Health Maintenance and Physical Exam  Exercise Activities and Dietary recommendations Goals   None     Immunization History  Administered Date(s) Administered  . Tdap 04/24/2010    Health Maintenance  Topic Date Due  . COVID-19 Vaccine (1) Never done  . TETANUS/TDAP  04/24/2020  . HIV Screening  01/10/2027 (Originally 05/25/1976)  . INFLUENZA VACCINE  06/04/2020  . COLONOSCOPY  10/08/2020  . Hepatitis C Screening  Completed    Discussed health benefits of physical activity, and encouraged him to engage in regular exercise appropriate for his age and condition.  1. Annual physical exam Followed by Dr. Nehemiah Massed for skin exam. He states he has had eye exam within the last year.  - PSA Total (Reflex To Free) ([facility] only) - CBC - Comprehensive metabolic panel - Lipid panel   2. Prostate cancer screening  - PSA Total (Reflex To Free) ([facility] only)  3. Need for shingles vaccine He declined today  4. Need for tetanus, diphtheria, and acellular pertussis (Tdap) vaccine in patient of adolescent age or older  - Tdap vaccine greater than or equal to 7yo IM  5. Hypertriglyceridemia Is following healthy diet.  - CBC - Lipid panel  6. Meniere's disease, unspecified laterality Stable, managed by dr. Pryor Ochoa - Comprehensive metabolic panel  7. Hyperuricemia/history of gout Doing well with current dose of allopurinol. Considering risks of renal toxicity, I would not recommend lesinorad unless he needs to have at least 300mg  allopurinol a day and has regular gout flares  - Uric acid  8. History of SCC (squamous cell carcinoma) of skin Followed regularly by Dr. Nehemiah Massed.    No follow-ups on file.        [name], MD  [facility] [phone] (phone) [phone] (fax)  [employer]

## 2020-04-26 ENCOUNTER — Other Ambulatory Visit: Payer: Self-pay

## 2020-04-26 ENCOUNTER — Encounter: Payer: Self-pay | Admitting: Family Medicine

## 2020-04-26 ENCOUNTER — Ambulatory Visit (INDEPENDENT_AMBULATORY_CARE_PROVIDER_SITE_OTHER): Payer: 59 | Admitting: Family Medicine

## 2020-04-26 VITALS — BP 122/75 | HR 68 | Temp 96.8°F | Ht 77.0 in | Wt 211.6 lb

## 2020-04-26 DIAGNOSIS — Z85828 Personal history of other malignant neoplasm of skin: Secondary | ICD-10-CM

## 2020-04-26 DIAGNOSIS — Z8739 Personal history of other diseases of the musculoskeletal system and connective tissue: Secondary | ICD-10-CM

## 2020-04-26 DIAGNOSIS — E781 Pure hyperglyceridemia: Secondary | ICD-10-CM

## 2020-04-26 DIAGNOSIS — Z23 Encounter for immunization: Secondary | ICD-10-CM

## 2020-04-26 DIAGNOSIS — Z0000 Encounter for general adult medical examination without abnormal findings: Secondary | ICD-10-CM

## 2020-04-26 DIAGNOSIS — E790 Hyperuricemia without signs of inflammatory arthritis and tophaceous disease: Secondary | ICD-10-CM | POA: Diagnosis not present

## 2020-04-26 DIAGNOSIS — H8109 Meniere's disease, unspecified ear: Secondary | ICD-10-CM

## 2020-04-26 DIAGNOSIS — Z125 Encounter for screening for malignant neoplasm of prostate: Secondary | ICD-10-CM | POA: Diagnosis not present

## 2020-04-26 NOTE — Patient Instructions (Addendum)
•   Please review the attached list of medications and notify my office if there are any errors.    Please bring all of your medications to every appointment so we can make sure that our medication list is the same as yours.    You need to wait 2 weeks after having the tetanus-pertussis before getting the Covid vaccine   Covid-19 vaccines: The Covid vaccines have been given to hundreds of millions of people and found to be very effective and are as safe as any other vaccine.  The [employer] vaccine has been associated with very rare dangerous blood clots, but only in adult women under the age of 39.  The risk of dying from Covid infections is much higher than having a serious reaction to the vaccine.  I strongly recommend getting fully vaccinated against Covid-19.  I recommend that adult women under 60 get fully vaccinated, but the [place] vaccines may be safer for those women than the [employer] vaccine.

## 2020-04-27 ENCOUNTER — Encounter: Payer: Self-pay | Admitting: Family Medicine

## 2020-04-27 DIAGNOSIS — R7303 Prediabetes: Secondary | ICD-10-CM | POA: Insufficient documentation

## 2020-04-27 DIAGNOSIS — R739 Hyperglycemia, unspecified: Secondary | ICD-10-CM | POA: Insufficient documentation

## 2020-04-27 LAB — CBC
Hematocrit: 46.2 % (ref 37.5–51.0)
Hemoglobin: 16 g/dL (ref 13.0–17.7)
MCH: 32.5 pg (ref 26.6–33.0)
MCHC: 34.6 g/dL (ref 31.5–35.7)
MCV: 94 fL (ref 79–97)
Platelets: 184 10*3/uL (ref 150–450)
RBC: 4.93 x10E6/uL (ref 4.14–5.80)
RDW: 12.8 % (ref 11.6–15.4)
WBC: 7.1 10*3/uL (ref 3.4–10.8)

## 2020-04-27 LAB — COMPREHENSIVE METABOLIC PANEL
ALT: 23 IU/L (ref 0–44)
AST: 17 IU/L (ref 0–40)
Albumin/Globulin Ratio: 2.2 (ref 1.2–2.2)
Albumin: 4.6 g/dL (ref 3.8–4.9)
Alkaline Phosphatase: 86 IU/L (ref 48–121)
BUN/Creatinine Ratio: 13 (ref 9–20)
BUN: 15 mg/dL (ref 6–24)
Bilirubin Total: 0.3 mg/dL (ref 0.0–1.2)
CO2: 24 mmol/L (ref 20–29)
Calcium: 9.3 mg/dL (ref 8.7–10.2)
Chloride: 97 mmol/L (ref 96–106)
Creatinine, Ser: 1.15 mg/dL (ref 0.76–1.27)
GFR calc Af Amer: 80 mL/min/{1.73_m2} (ref >59)
GFR calc non Af Amer: 69 mL/min/{1.73_m2} (ref >59)
Globulin, Total: 2.1 g/dL (ref 1.5–4.5)
Glucose: 132 mg/dL — ABNORMAL HIGH (ref 65–99)
Potassium: 3.8 mmol/L (ref 3.5–5.2)
Sodium: 138 mmol/L (ref 134–144)
Total Protein: 6.7 g/dL (ref 6.0–8.5)

## 2020-04-27 LAB — LIPID PANEL
Chol/HDL Ratio: 5.2 ratio — ABNORMAL HIGH (ref 0.0–5.0)
Cholesterol, Total: 170 mg/dL (ref 100–199)
HDL: 33 mg/dL — ABNORMAL LOW (ref >39)
LDL Chol Calc (NIH): 114 mg/dL — ABNORMAL HIGH (ref 0–99)
Triglycerides: 129 mg/dL (ref 0–149)
VLDL Cholesterol Cal: 23 mg/dL (ref 5–40)

## 2020-04-27 LAB — PSA TOTAL (REFLEX TO FREE): Prostate Specific Ag, Serum: 0.4 ng/mL (ref 0.0–4.0)

## 2020-04-27 LAB — URIC ACID: Uric Acid: 7 mg/dL (ref 3.8–8.4)

## 2020-05-01 ENCOUNTER — Other Ambulatory Visit: Payer: Self-pay | Admitting: Family Medicine

## 2020-05-16 ENCOUNTER — Telehealth: Payer: Self-pay

## 2020-05-16 NOTE — Telephone Encounter (Signed)
Patient scheduled for a OV to check A1C.

## 2020-05-16 NOTE — Telephone Encounter (Signed)
Copied from [place] [id number]. Topic: General - Other >> May 16, 2020  1:07 PM Antonieta Iba C wrote: Reason for CRM: pt called in to make provider aware that he is concerned about his glucose level and would like to be advised.   Pt says that he has noticed that his urine smells sweet. Pt says that he tested his blood sugar level on Sunday afternoon an his level was 280.   Pt would like to know what should he do? Pt says that he exercise and eat healthier, he's not sure why his level is so high.   CB: [phone]

## 2020-05-17 ENCOUNTER — Telehealth: Payer: Self-pay | Admitting: Family Medicine

## 2020-05-17 NOTE — Telephone Encounter (Signed)
Patient's appointment was cancelled with [facility]. Patient is unsure if he needs to come in or wait to see Simona Huh when he returns since Dr. Caryn Section is not available. Please call pt back to let him know what Dr. Caryn Section thinks on this.  Patient was looking at the medications he is currently taking that can cause his insulin to go up. hydrochlorothiazide (HYDRODIURIL) 25 MG tablet predniSONE (DELTASONE) 10 MG tablet Sufedrrin.  Please call pt back to discuss and to reschedule an appointment.  Thanks, [employer]

## 2020-05-17 NOTE — Telephone Encounter (Signed)
Tried calling patient to reschedule appointment with another provider that has an opening. Patient does need an office visit so that we can check an A1C. This test can be done in the office during his appointment. Left message to call back. OK for [facility] triage to advise patient and reschedule appointment.

## 2020-05-19 ENCOUNTER — Ambulatory Visit: Payer: 59 | Admitting: Family Medicine

## 2020-05-24 NOTE — Progress Notes (Signed)
Established patient visit   Patient: Paul Espinoza   DOB: Aug 23, 1961   59 y.o. Male  MRN: [id number] Visit Date: 05/25/2020  Today's healthcare provider: Trinna Post, PA-C   Chief Complaint  Patient presents with  . Hyperglycemia  I,Deacon Gadbois M Harryette Shuart,acting as a scribe for Trinna Post, PA-C.,have documented all relevant documentation on the behalf of Trinna Post, PA-C,as directed by  Trinna Post, PA-C while in the presence of Trinna Post, PA-C.  Subjective    HPI   Hyperglycemia Patient reports that he was advised to come have A1C checked by his PCP due to elevated glucose reading at home. Patient reports that his fasting glucose reading at home arranges around 150's- 190's. Patient states that he does take Prednisone 10 mg once weekly for Meniere's disease and reports that it helps.       Medications: Outpatient Medications Prior to Visit  Medication Sig  . allopurinol (ZYLOPRIM) 100 MG tablet TAKE TWO TABLETS BY MOUTH DAILY  . colchicine 0.6 MG tablet TAKE TWO TABLETS BY MOUTH ON FIRST DAY, THEN TAKE ONE TABLET BY MOUTH DAILY  . fluticasone (FLONASE) 50 MCG/ACT nasal spray Place 2 sprays into both nostrils daily.  . hydrochlorothiazide (HYDRODIURIL) 25 MG tablet Take 50 mg by mouth daily.   . indomethacin (INDOCIN) 50 MG capsule TAKE ONE CAPSULE BY MOUTH THREE TIMES A DAY WITH FOOD AS NEEDED FOR GOUT FLARES  . KLOR-CON M20 20 MEQ tablet Take 20 mEq by mouth daily.  . Multiple Vitamin (MULTIVITAMIN) capsule Take 1 capsule by mouth daily.  . predniSONE (DELTASONE) 10 MG tablet Take 10 mg by mouth once a week.    No facility-administered medications prior to visit.    Review of Systems    Objective    BP (!) 132/78 (BP Location: Left Arm, Patient Position: Sitting, Cuff Size: Normal)   Pulse 79   Temp (!) 97.1 F (36.2 C) (Temporal)   Wt (!) 209 lb 6.4 oz (95 kg)   SpO2 98%   BMI 24.83 kg/m    Physical Exam Constitutional:       Appearance: Normal appearance.  Cardiovascular:     Rate and Rhythm: Normal rate and regular rhythm.  Pulmonary:     Effort: Pulmonary effort is normal. No respiratory distress.  Skin:    General: Skin is warm and dry.  Neurological:     Mental Status: He is alert and oriented to person, place, and time. Mental status is at baseline.  Psychiatric:        Mood and Affect: Mood normal.        Behavior: Behavior normal.       Results for orders placed or performed in visit on 05/25/20  POCT glycosylated hemoglobin (Hb A1C)  Result Value Ref Range   Hemoglobin A1C 6.4 (A) 4.0 - 5.6 %   HbA1c POC (<> result, manual entry)     HbA1c, POC (prediabetic range)     HbA1c, POC (controlled diabetic range)     Est. average glucose Bld gHb Est-mCnc 137     Assessment & Plan    1. Hyperglycemia  Patient is in the prediabetic range at 6.4%. This may be a combination of metabolic disorder and medication use. Discussed that he might trial off prednisone and observe that effect on his sugars. However, this could worsen his meniere's. Alternatively, if he would like to stay on prednisone we could consider adding metformin XR 500 mg BID.  He will talk to his ENT specialist Dr. Freddy Jaksch at [facility] in [place] and let us know how he would like to proceed.   - POCT glycosylated hemoglobin (Hb A1C)    No follow-ups on file.      ITrinna Post, PA-C, have reviewed all documentation for this visit. The documentation on 05/25/20 for the exam, diagnosis, procedures, and orders are all accurate and complete.    Paulene Floor  [facility] [phone] (phone) [phone] (fax)  [place]

## 2020-05-25 ENCOUNTER — Other Ambulatory Visit: Payer: Self-pay

## 2020-05-25 ENCOUNTER — Ambulatory Visit: Payer: 59 | Admitting: Dermatology

## 2020-05-25 ENCOUNTER — Ambulatory Visit (INDEPENDENT_AMBULATORY_CARE_PROVIDER_SITE_OTHER): Payer: 59 | Admitting: Physician Assistant

## 2020-05-25 ENCOUNTER — Encounter: Payer: Self-pay | Admitting: Physician Assistant

## 2020-05-25 VITALS — BP 132/78 | HR 79 | Temp 97.1°F | Wt 209.4 lb

## 2020-05-25 DIAGNOSIS — R739 Hyperglycemia, unspecified: Secondary | ICD-10-CM

## 2020-05-25 DIAGNOSIS — L578 Other skin changes due to chronic exposure to nonionizing radiation: Secondary | ICD-10-CM

## 2020-05-25 DIAGNOSIS — L820 Inflamed seborrheic keratosis: Secondary | ICD-10-CM | POA: Diagnosis not present

## 2020-05-25 DIAGNOSIS — D1800 Hemangioma unspecified site: Secondary | ICD-10-CM

## 2020-05-25 DIAGNOSIS — D229 Melanocytic nevi, unspecified: Secondary | ICD-10-CM

## 2020-05-25 DIAGNOSIS — Z85828 Personal history of other malignant neoplasm of skin: Secondary | ICD-10-CM

## 2020-05-25 DIAGNOSIS — L814 Other melanin hyperpigmentation: Secondary | ICD-10-CM

## 2020-05-25 DIAGNOSIS — L821 Other seborrheic keratosis: Secondary | ICD-10-CM

## 2020-05-25 DIAGNOSIS — Z1283 Encounter for screening for malignant neoplasm of skin: Secondary | ICD-10-CM | POA: Diagnosis not present

## 2020-05-25 LAB — POCT GLYCOSYLATED HEMOGLOBIN (HGB A1C)
Est. average glucose Bld gHb Est-mCnc: 137
Hemoglobin A1C: 6.4 % — AB (ref 4.0–5.6)

## 2020-05-25 NOTE — Patient Instructions (Signed)
Hyperglycemia Hyperglycemia occurs when the level of sugar (glucose) in the blood is too high. Glucose is a type of sugar that provides the body's main source of energy. Certain hormones (insulin and glucagon) control the level of glucose in the blood. Insulin lowers blood glucose, and glucagon increases blood glucose. Hyperglycemia can result from having too little insulin in the bloodstream, or from the body not responding normally to insulin. Hyperglycemia occurs most often in people who have diabetes (diabetes mellitus), but it can happen in people who do not have diabetes. It can develop quickly, and it can be life-threatening if it causes you to become severely dehydrated (diabetic ketoacidosis or hyperglycemic hyperosmolar state). Severe hyperglycemia is a medical emergency. What are the causes? If you have diabetes, hyperglycemia may be caused by:  Diabetes medicine.  Medicines that increase blood glucose or affect your diabetes control.  Not eating enough, or not eating often enough.  Changes in physical activity level.  Being sick or having an infection. If you have prediabetes or undiagnosed diabetes:  Hyperglycemia may be caused by those conditions. If you do not have diabetes, hyperglycemia may be caused by:  Certain medicines, including steroid medicines, beta-blockers, epinephrine, and thiazide diuretics.  Stress.  Serious illness.  Surgery.  Diseases of the pancreas.  Infection. What increases the risk? Hyperglycemia is more likely to develop in people who have risk factors for diabetes, such as:  Having a family member with diabetes.  Having a gene for type 1 diabetes that is passed from parent to child (inherited).  Living in an area with cold weather conditions.  Exposure to certain viruses.  Certain conditions in which the body's disease-fighting (immune) system attacks itself (autoimmune disorders).  Being overweight or obese.  Having an inactive  (sedentary) lifestyle.  Having been diagnosed with insulin resistance.  Having a history of prediabetes, gestational diabetes, or polycystic ovarian syndrome (PCOS).  Being of American-Indian, African-American, Hispanic/Latino, or Asian/Pacific Islander descent. What are the signs or symptoms? Hyperglycemia may not cause any symptoms. If you do have symptoms, they may include early warning signs, such as:  Increased thirst.  Hunger.  Feeling very tired.  Needing to urinate more often than usual.  Blurry vision. Other symptoms may develop if hyperglycemia gets worse, such as:  Dry mouth.  Loss of appetite.  Fruity-smelling breath.  Weakness.  Unexpected or rapid weight gain or weight loss.  Tingling or numbness in the hands or feet.  Headache.  Skin that does not quickly return to normal after being lightly pinched and released (poor skin turgor).  Abdominal pain.  Cuts or bruises that are slow to heal. How is this diagnosed? Hyperglycemia is diagnosed with a blood test to measure your blood glucose level. This blood test is usually done while you are having symptoms. Your health care provider may also do a physical exam and review your medical history. You may have more tests to determine the cause of your hyperglycemia, such as:  A fasting blood glucose (FBG) test. You will not be allowed to eat (you will fast) for at least 8 hours before a blood sample is taken.  An A1c (hemoglobin A1c) blood test. This provides information about blood glucose control over the previous 2-3 months.  An oral glucose tolerance test (OGTT). This measures your blood glucose at two times: ? After fasting. This is your baseline blood glucose level. ? Two hours after drinking a beverage that contains glucose. How is this treated? Treatment depends on the cause   of your hyperglycemia. Treatment may include:  Taking medicine to regulate your blood glucose levels. If you take insulin or  other diabetes medicines, your medicine or dosage may be adjusted.  Lifestyle changes, such as exercising more, eating healthier foods, or losing weight.  Treating an illness or infection, if this caused your hyperglycemia.  Checking your blood glucose more often.  Stopping or reducing steroid medicines, if these caused your hyperglycemia. If your hyperglycemia becomes severe and it results in hyperglycemic hyperosmolar state, you must be hospitalized and given IV fluids. Follow these instructions at home:  General instructions  Take over-the-counter and prescription medicines only as told by your health care provider.  Do not use any products that contain nicotine or tobacco, such as cigarettes and e-cigarettes. If you need help quitting, ask your health care provider.  Limit alcohol intake to no more than 1 drink per day for nonpregnant women and 2 drinks per day for men. One drink equals 12 oz of beer, 5 oz of wine, or 1 oz of hard liquor.  Learn to manage stress. If you need help with this, ask your health care provider.  Keep all follow-up visits as told by your health care provider. This is important. Eating and drinking   Maintain a healthy weight.  Exercise regularly, as directed by your health care provider.  Stay hydrated, especially when you exercise, get sick, or spend time in hot temperatures.  Eat healthy foods, such as: ? Lean proteins. ? Complex carbohydrates. ? Fresh fruits and vegetables. ? Low-fat dairy products. ? Healthy fats.  Drink enough fluid to keep your urine clear or pale yellow. If you have diabetes:  Make sure you know the symptoms of hyperglycemia.  Follow your diabetes management plan, as told by your health care provider. Make sure you: ? Take your insulin and medicines as directed. ? Follow your exercise plan. ? Follow your meal plan. Eat on time, and do not skip meals. ? Check your blood glucose as often as directed. Make sure to  check your blood glucose before and after exercise. If you exercise longer or in a different way than usual, check your blood glucose more often. ? Follow your sick day plan whenever you cannot eat or drink normally. Make this plan in advance with your health care provider.  Share your diabetes management plan with people in your workplace, school, and household.  Check your urine for ketones when you are ill and as told by your health care provider.  Carry a medical alert card or wear medical alert jewelry. Contact a health care provider if:  Your blood glucose is at or above 240 mg/dL (13.3 mmol/L) for 2 days in a row.  You have problems keeping your blood glucose in your target range.  You have frequent episodes of hyperglycemia. Get help right away if:  You have difficulty breathing.  You have a change in how you think, feel, or act (mental status).  You have nausea or vomiting that does not go away. These symptoms may represent a serious problem that is an emergency. Do not wait to see if the symptoms will go away. Get medical help right away. Call your local emergency services (911 in the [place] Do not drive yourself to the hospital. Summary  Hyperglycemia occurs when the level of sugar (glucose) in the blood is too high.  Hyperglycemia is diagnosed with a blood test to measure your blood glucose level. This blood test is usually done while you are   having symptoms. Your health care provider may also do a physical exam and review your medical history.  If you have diabetes, follow your diabetes management plan as told by your health care provider.  Contact your health care provider if you have problems keeping your blood glucose in your target range. This information is not intended to replace advice given to you by your health care provider. Make sure you discuss any questions you have with your health care provider. Document Revised: 07/08/2016 Document Reviewed:  07/08/2016 Elsevier Patient Education  2020 [employer] Inc[employer]  

## 2020-05-25 NOTE — Patient Instructions (Addendum)

## 2020-05-25 NOTE — Progress Notes (Signed)
Follow-Up Visit   Subjective  Paul Espinoza is a 59 y.o. male who presents for the following: TBSE (Has a few areas on concern on R middle finger, R. Upper Arm, behind both knees). Patient presents today for annual TBSE, patient does have a few areas of concern on Right middle finger, R upper arm, and Behind both knees. Patient has a h/o SCC Left med cheek 01/31/20 The patient presents for Total-Body Skin Exam (TBSE) for skin cancer screening and mole check.  The following portions of the chart were reviewed this encounter and updated as appropriate:  Tobacco  Allergies  Meds  Problems  Med Hx  Surg Hx  Fam Hx     Review of Systems:  No other skin or systemic complaints except as noted in HPI or Assessment and Plan.  Objective  Well appearing patient in no apparent distress; mood and affect are within normal limits.  A full examination was performed including scalp, head, eyes, ears, nose, lips, neck, chest, axillae, abdomen, back, buttocks, bilateral upper extremities, bilateral lower extremities, hands, feet, fingers, toes, fingernails, and toenails. All findings within normal limits unless otherwise noted below.  Objective  Left Medial Cheek: Well healed scar with no evidence of recurrence, no lymphadenopathy.   Objective  B/L Popliteal Fossa (2), Left Mid Intraclavicular, Left calf (2), Right Bicep x 3 (3), Right Middle Finger, Right forearm (2): Erythematous keratotic or waxy stuck-on papule or plaque.    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 Observe for recurrence. Call clinic for new or changing lesions.  Recommend regular skin exams, daily broad-spectrum spf 30+ sunscreen use, and photoprotection.     Inflamed seborrheic keratosis (11) B/L Popliteal Fossa (2); Right forearm (2); Right Bicep x 3 (3); Left calf (2); Left Mid Intraclavicular; Right Middle Finger  Cryotherapy today Prior to procedure,  discussed risks of blister formation, small wound, skin dyspigmentation, or rare scar following cryotherapy.    Destruction of lesion - B/L Popliteal Fossa, Left Mid Intraclavicular, Left calf, Right Bicep x 3, Right Middle Finger, Right forearm Complexity: simple   Destruction method: cryotherapy   Informed consent: discussed and consent obtained   Timeout:  patient name, date of birth, surgical site, and procedure verified Lesion destroyed using liquid nitrogen: Yes   Region frozen until ice ball extended beyond lesion: Yes   Outcome: patient tolerated procedure well with no complications   Post-procedure details: wound care instructions given    Skin cancer screening   Lentigines - Scattered tan macules - Discussed due to sun exposure - Benign, observe - Call for any changes  Seborrheic Keratoses - Stuck-on, waxy, tan-brown papules and plaques  - Discussed benign etiology and prognosis. - Observe - Call for any changes  Melanocytic Nevi - Tan-brown and/or pink-flesh-colored symmetric macules and papules - Benign appearing on exam today - Observation - Call clinic for new or changing moles - Recommend daily use of broad spectrum spf 30+ sunscreen to sun-exposed areas.   Hemangiomas - Red papules - Discussed benign nature - Observe - Call for any changes  Actinic Damage - diffuse scaly erythematous macules with underlying dyspigmentation - Recommend daily broad spectrum sunscreen SPF 30+ to sun-exposed areas, reapply every 2 hours as needed.  - Call for new or changing lesions.  Skin cancer screening performed today.  History of Squamous Cell Carcinoma of the Skin - No evidence of recurrence today - No lymphadenopathy -  Recommend regular full body skin exams - Recommend daily broad spectrum sunscreen SPF 30+ to sun-exposed areas, reapply every 2 hours as needed.  - Call if any new or changing lesions are noted between office visits   Return in about 1 year  (around 05/25/2021) for TBSE.   I, Donzetta Kohut, CMA, am acting as scribe for Sarina Ser, MD . Documentation: I have reviewed the above documentation for accuracy and completeness, and I agree with the above.  Sarina Ser, MD

## 2020-05-28 ENCOUNTER — Encounter: Payer: Self-pay | Admitting: Dermatology

## 2020-05-29 ENCOUNTER — Ambulatory Visit: Payer: Self-pay

## 2020-05-29 ENCOUNTER — Telehealth: Payer: Self-pay

## 2020-05-29 DIAGNOSIS — R739 Hyperglycemia, unspecified: Secondary | ICD-10-CM

## 2020-05-29 MED ORDER — METFORMIN HCL ER 500 MG PO TB24: 500.0000 mg | ORAL_TABLET | Freq: Every day | ORAL | 1 refills | Status: DC

## 2020-05-29 NOTE — Addendum Note (Signed)
Addended by: Birdie Sons on: 05/29/2020 03:10 PM   Modules accepted: Orders

## 2020-05-29 NOTE — Telephone Encounter (Signed)
Patient called with questions about his medications and his high A1c which has him starting Metformin.  He was wondering if all the prednisone he has been taking ans well as HCTZ could be raising his sugar.  He states that he walks 3 miles per day and eats healthy. He was advised to continue his healthy habits.   He is questioning the Metformin although he has not started the medication for its know side effects. He states that his Father in Law could not take it and stated to him that there is better medication. Please advise patient in AM.  Reason for Disposition . [1] Caller has URGENT medicine question about med that PCP or specialist prescribed AND [2] triager unable to answer question  Answer Assessment - Initial Assessment Questions 1. NAME of MEDICATION: "What medicine are you calling about?"     Metformin Prednisone, HCTZ 2. QUESTION: "What is your question?" (e.g., medication refill, side effect)    Can all these affect my Blood sugar 3. PRESCRIBING HCP: "Who prescribed it?" Reason: if prescribed by specialist, call should be referred to that group.    Dr Caryn Section and ear doctor 4. SYMPTOMS: "Do you have any symptoms?"     None  5. SEVERITY: If symptoms are present, ask "Are they mild, moderate or severe?"    N/A 6. PREGNANCY:  "Is there any chance that you are pregnant?" "When was your last menstrual period?"    N/A  Protocols used: MEDICATION QUESTION CALL-A-AH

## 2020-05-29 NOTE — Telephone Encounter (Signed)
Have sent prescription for metformin to take once daily to [facility]. He needs to follow up to check a1c in 3 months.

## 2020-05-29 NOTE — Telephone Encounter (Signed)
Patient was last seen by Paul Espinoza on 05/25/2020 for Hyperglycemia. Patient is calling back to inform us that he would like to proceed with treating his elevated blood sugars. Please advise.

## 2020-05-29 NOTE — Telephone Encounter (Addendum)
Tried calling patient. Left message to call back. OK for [facility] triage to advise patient of message and schedule 3 month follow up appointment.

## 2020-05-29 NOTE — Telephone Encounter (Signed)
Copied from [place] [id number]. Topic: General - Other >> May 29, 2020  1:37 PM Celene Kras wrote: Reason for CRM: Pt called and is requesting to have advice regarding his recent elevated sugar levels. Pt states that he saw his specialist and that he was advised to treat his sugar level. Please advise.

## 2020-05-30 NOTE — Telephone Encounter (Signed)
Patient advised of message below and verbalized understanding. Patient would like to start taking Metformin. He has the prescription and plans to start taking this week. Patient says he has a blood glucose monitor at home that he has been using to check his sugars. He wants to know if Dr. Caryn Section recommend that he continue checking his blood sugars at home while on the Metformin? If so, how often do you recommend he check his sugars?  Or should he not be checking it at all. Patient has an appointment scheduled in October for a A1C recheck and wants to know if keeping a blood sugar log would be helpful for that appointment.

## 2020-05-30 NOTE — Telephone Encounter (Signed)
It's helpful to have an idea what the fasting blood sugars are running, but only needs to check it 2-3 times a week and bring record of the sugars to his next appt.

## 2020-05-30 NOTE — Telephone Encounter (Signed)
Metformin is the medication of choice for pre-diabetes, and is universally considered to be the first line medication for diabetes. Prednisone can definitely raise blood sugar. His a1c is not really high enough that has to take medication at this point, so if he doesn't want to take metformin yet, then I would not recommend taking any medication.

## 2020-05-30 NOTE — Telephone Encounter (Signed)
Phone call to pt.  Advised of recommendation per Dr. Caryn Section to begin Metformin qd and recheck Hbg A1C in 3 mos.  Pt. Stated he has a 3 mo. appt. sched. in Oct., and will have lab repeated at that time.  Reported he has heard from a relative, who is a Diabetic, "don't let them put you on Metformin."  Pt. questioned if there is another medication that Dr. Caryn Section can prescribe of "equal potency and effectiveness?"  Also questioned how frequently Dr. Caryn Section would like him to check his blood sugars?  Pt. Reported that he has been taking HCTZ  "awhile", and had the dose increased on 03/10/20, to 50 mg. qd., and has read that "Thiazide" can cause an increase in blood sugars.  Also reported he has been on his 3rd Prednisone Taper, and has had 4 steroid injections since approx. March, and questioned if this could be contributing to higher blood sugar readings?  Reported he goes to an ENT Specialist in [place], Dr. Marvell Fuller, who has been treating him for Meniere's Syndrome,with the above treatment regime.   Advised all the above information will be directed to Dr. Caryn Section, for further recommendations.  Agreed with plan.

## 2020-05-30 NOTE — Telephone Encounter (Signed)
Patient advised.

## 2020-06-05 ENCOUNTER — Other Ambulatory Visit: Payer: Self-pay | Admitting: Family Medicine

## 2020-07-05 ENCOUNTER — Other Ambulatory Visit: Payer: Self-pay | Admitting: Family Medicine

## 2020-07-31 ENCOUNTER — Telehealth: Payer: Self-pay

## 2020-07-31 DIAGNOSIS — Z1211 Encounter for screening for malignant neoplasm of colon: Secondary | ICD-10-CM

## 2020-07-31 DIAGNOSIS — Z86010 Personal history of colonic polyps: Secondary | ICD-10-CM

## 2020-07-31 NOTE — Telephone Encounter (Signed)
Copied from [place] [id number]. Topic: General - Other >> Jul 31, 2020 10:31 AM Leward Quan A wrote: Reason for CRM: Patient called to inform Dr Caryn Section that he is due for his colonoscopy and would like that to be scheduled asking if Dr Caryn Section can have the referral sent out for that please and get him scheduled. Please call patient with questiong and update at Ph# [phone]

## 2020-07-31 NOTE — Telephone Encounter (Signed)
Patient states that he would like to go back to [place]

## 2020-07-31 NOTE — Telephone Encounter (Signed)
Does he want to go back to [facility] or to [facility]?

## 2020-08-02 ENCOUNTER — Other Ambulatory Visit: Payer: Self-pay | Admitting: Family Medicine

## 2020-08-02 NOTE — Telephone Encounter (Signed)
Requested Prescriptions  Pending Prescriptions Disp Refills   allopurinol (ZYLOPRIM) 100 MG tablet [Pharmacy Med Name: ALLOPURINOL 100 MG TABLET] 60 tablet 0    Sig: TAKE TWO TABLETS BY MOUTH DAILY     Endocrinology:  Gout Agents Passed - 08/02/2020  6:20 AM      Passed - Uric Acid in normal range and within 360 days    Uric Acid  Date Value Ref Range Status  04/26/2020 7.0 3.8 - 8.4 mg/dL Final    Comment:               Therapeutic target for gout patients: <6.0         Passed - Cr in normal range and within 360 days    Creatinine, Ser  Date Value Ref Range Status  04/26/2020 1.15 0.76 - 1.27 mg/dL Final         Passed - Valid encounter within last 12 months    Recent Outpatient Visits          2 months ago Hyperglycemia   [facility] Trinna Post, PA-C   3 months ago Annual physical exam   [facility] Birdie Sons, MD   1 year ago Annual physical exam   [facility] Birdie Sons, MD   1 year ago Cough   [facility] Birdie Sons, MD   2 years ago Annual physical exam   [facility] Birdie Sons, MD      Future Appointments            In 3 weeks Fisher, Kirstie Peri, MD [facility], [place]   In 9 months Fisher, Kirstie Peri, MD [facility], [place]

## 2020-08-07 ENCOUNTER — Other Ambulatory Visit: Payer: Self-pay | Admitting: Family Medicine

## 2020-08-07 NOTE — Telephone Encounter (Signed)
This request has been addressed by other.

## 2020-08-28 NOTE — Progress Notes (Signed)
Established patient visit   Patient: Paul Espinoza   DOB: [birth year]   59 y.o. Male  MRN: [id number] Visit Date: 08/29/2020  Today's healthcare provider: [name], MD   Chief Complaint  Patient presents with  . Hyperglycemia   Subjective    HPI  Hyperglycemia, Follow-up  Lab Results  Component Value Date   HGBA1C 6.4 (A) 05/25/2020   GLUCOSE 132 (H) 04/26/2020   GLUCOSE 113 (H) 04/16/2019   GLUCOSE 112 (H) 02/02/2018    Last seen for for this 3 months ago (seen by Carles Collet, PA-C).  Management since that visit includes prescribing Metformin 500mg  daily, but he decided to go on strict diet instead of starting medications.  Current symptoms include none and have been stable.  Patient never started Metformin. He decided to work on improving diet and exercising 5 days weekly.  Home blood sugars have averaged 105.  Prior visit with dietician: no Current diet: well balanced Current exercise: running/ jogging  Pertinent Labs:    Component Value Date/Time   CHOL 170 04/26/2020 0914   TRIG 129 04/26/2020 0914   CHOLHDL 5.2 (H) 04/26/2020 0914   CREATININE 1.15 04/26/2020 0914    Wt Readings from Last 3 Encounters:  08/29/20 195 lb (88.5 kg)  05/25/20 (!) 209 lb 6.4 oz (95 kg)  04/26/20 211 lb 9.6 oz (96 kg)    -----------------------------------------------------------------------------------------     Medications: Outpatient Medications Prior to Visit  Medication Sig  . allopurinol (ZYLOPRIM) 100 MG tablet TAKE TWO TABLETS BY MOUTH DAILY  . colchicine 0.6 MG tablet TAKE TWO TABLETS BY MOUTH ON FIRST DAY, THEN TAKE ONE TABLET BY MOUTH DAILY  . fluticasone (FLONASE) 50 MCG/ACT nasal spray Place 2 sprays into both nostrils daily.  . hydrochlorothiazide (HYDRODIURIL) 50 MG tablet Take 50 mg by mouth daily.  . indomethacin (INDOCIN) 50 MG capsule TAKE ONE CAPSULE BY MOUTH THREE TIMES A DAY WITH FOOD AS NEEDED FOR GOUT FLARES  . KLOR-CON M20 20 MEQ  tablet Take 20 mEq by mouth daily.  . Multiple Vitamin (MULTIVITAMIN) capsule Take 1 capsule by mouth daily.  . predniSONE (DELTASONE) 10 MG tablet Take 10 mg by mouth once a week.   . metFORMIN (GLUCOPHAGE-XR) 500 MG 24 hr tablet Take 1 tablet (500 mg total) by mouth daily with breakfast.  . [DISCONTINUED] hydrochlorothiazide (HYDRODIURIL) 25 MG tablet Take 50 mg by mouth daily.    No facility-administered medications prior to visit.    Review of Systems  Constitutional: Negative for appetite change, chills and fever.  Respiratory: Negative for chest tightness, shortness of breath and wheezing.   Cardiovascular: Negative for chest pain and palpitations.  Gastrointestinal: Negative for abdominal pain, nausea and vomiting.      Objective    BP 117/75 (BP Location: Right Arm, Patient Position: Sitting, Cuff Size: Normal)   Pulse 60   Temp 97.7 F (36.5 C) (Oral)   Resp 16   Wt 195 lb (88.5 kg)   BMI 23.12 kg/m    Physical Exam  General appearance: Well developed, well nourished male, cooperative and in no acute distress Head: Normocephalic, without obvious abnormality, atraumatic Respiratory: Respirations even and unlabored, normal respiratory rate Extremities: All extremities are intact.  Skin: Skin color, texture, turgor normal. No rashes seen  Psych: Appropriate mood and affect. Neurologic: Mental status: Alert, oriented to person, place, and time, thought content appropriate.   No results found for any visits on 08/29/20.  Assessment & Plan  1. Hyperglycemia Doing very well with dietary changes and intentional weight loss. Check a1c 1-2 x / year.   2. Family history of cancer of digestive system Due for follow up colonoscopy before the end of the year.   - Ambulatory referral to Gastroenterology  3. History of adenomatous polyp of colon  - Ambulatory referral to Gastroenterology  4. Colon cancer screening  - Ambulatory referral to  Gastroenterology  Recommended flu vaccine which he declined. He states he has had covid vaccines. .        The entirety of the information documented in the History of Present Illness, Review of Systems and Physical Exam were personally obtained by me. Portions of this information were initially documented by the CMA and reviewed by me for thoroughness and accuracy.      [name], MD  [facility] [phone] (phone) [phone] (fax)  [place]

## 2020-08-29 ENCOUNTER — Ambulatory Visit: Payer: 59 | Admitting: Family Medicine

## 2020-08-29 ENCOUNTER — Other Ambulatory Visit: Payer: Self-pay

## 2020-08-29 ENCOUNTER — Encounter: Payer: Self-pay | Admitting: Family Medicine

## 2020-08-29 VITALS — BP 117/75 | HR 60 | Temp 97.7°F | Resp 16 | Wt 195.0 lb

## 2020-08-29 DIAGNOSIS — Z800 Family history of malignant neoplasm of digestive organs: Secondary | ICD-10-CM | POA: Diagnosis not present

## 2020-08-29 DIAGNOSIS — Z86010 Personal history of colonic polyps: Secondary | ICD-10-CM | POA: Diagnosis not present

## 2020-08-29 DIAGNOSIS — R739 Hyperglycemia, unspecified: Secondary | ICD-10-CM

## 2020-08-29 DIAGNOSIS — Z1211 Encounter for screening for malignant neoplasm of colon: Secondary | ICD-10-CM | POA: Diagnosis not present

## 2020-08-29 LAB — POCT GLYCOSYLATED HEMOGLOBIN (HGB A1C)
Est. average glucose Bld gHb Est-mCnc: 114
Hemoglobin A1C: 5.6 % (ref 4.0–5.6)

## 2020-08-29 NOTE — Patient Instructions (Signed)
.   Please review the attached list of medications and notify my office if there are any errors.   . Please bring all of your medications to every appointment so we can make sure that our medication list is the same as yours.   

## 2020-08-31 ENCOUNTER — Other Ambulatory Visit: Payer: Self-pay

## 2020-08-31 ENCOUNTER — Encounter: Payer: Self-pay | Admitting: Dermatology

## 2020-08-31 ENCOUNTER — Ambulatory Visit: Payer: 59 | Admitting: Dermatology

## 2020-08-31 DIAGNOSIS — Z85828 Personal history of other malignant neoplasm of skin: Secondary | ICD-10-CM

## 2020-08-31 DIAGNOSIS — L578 Other skin changes due to chronic exposure to nonionizing radiation: Secondary | ICD-10-CM

## 2020-08-31 DIAGNOSIS — B079 Viral wart, unspecified: Secondary | ICD-10-CM | POA: Diagnosis not present

## 2020-08-31 DIAGNOSIS — L570 Actinic keratosis: Secondary | ICD-10-CM

## 2020-08-31 DIAGNOSIS — L820 Inflamed seborrheic keratosis: Secondary | ICD-10-CM

## 2020-08-31 DIAGNOSIS — D225 Melanocytic nevi of trunk: Secondary | ICD-10-CM | POA: Diagnosis not present

## 2020-08-31 DIAGNOSIS — D229 Melanocytic nevi, unspecified: Secondary | ICD-10-CM

## 2020-08-31 NOTE — Progress Notes (Signed)
Follow-Up Visit   Subjective  Paul Espinoza is a 59 y.o. male who presents for the following: check spots (face, arms, trunk, R leg, ~47m no symptoms).  The following portions of the chart were reviewed this encounter and updated as appropriate:  Tobacco  Allergies  Meds  Problems  Med Hx  Surg Hx  Fam Hx     Review of Systems:  No other skin or systemic complaints except as noted in HPI or Assessment and Plan.  Objective  Well appearing patient in no apparent distress; mood and affect are within normal limits.  A focused examination was performed including face, trunk, arms, hands, R leg. Relevant physical exam findings are noted in the Assessment and Plan.  Objective  R medial cheek x 1, R medial bicep x 1, L clavicle x 1, R medial knee x 1 (4): Erythematous keratotic or waxy stuck-on papule or plaque.   Objective  Right Ear anti tragus x 1, R medial brow x 1, R 4th finger x 1 (3): Pink scaly macules   Objective  Right proximal thumb x 1: Verrucous papules -- Discussed viral etiology and contagion.   Objective  Right mid side: Flesh pap   Assessment & Plan  Inflamed seborrheic keratosis (4) R medial cheek x 1, R medial bicep x 1, L clavicle x 1, R medial knee x 1  Destruction of lesion - R medial cheek x 1, R medial bicep x 1, L clavicle x 1, R medial knee x 1 Complexity: simple   Destruction method: cryotherapy   Informed consent: discussed and consent obtained   Timeout:  patient name, date of birth, surgical site, and procedure verified Lesion destroyed using liquid nitrogen: Yes   Region frozen until ice ball extended beyond lesion: Yes   Outcome: patient tolerated procedure well with no complications   Post-procedure details: wound care instructions given    AK (actinic keratosis) (3) Right Ear anti tragus x 1, R medial brow x 1, R 4th finger x 1  Destruction of lesion - Right Ear anti tragus x 1, R medial brow x 1, R 4th finger x 1 Complexity:  simple   Destruction method: cryotherapy   Informed consent: discussed and consent obtained   Timeout:  patient name, date of birth, surgical site, and procedure verified Lesion destroyed using liquid nitrogen: Yes   Region frozen until ice ball extended beyond lesion: Yes   Outcome: patient tolerated procedure well with no complications   Post-procedure details: wound care instructions given    Viral warts, unspecified type Right proximal thumb x 1  Destruction of lesion - Right proximal thumb x 1 Complexity: simple   Destruction method: cryotherapy   Informed consent: discussed and consent obtained   Timeout:  patient name, date of birth, surgical site, and procedure verified Lesion destroyed using liquid nitrogen: Yes   Region frozen until ice ball extended beyond lesion: Yes   Outcome: patient tolerated procedure well with no complications   Post-procedure details: wound care instructions given    Nevus Right mid side  Benign appearing, observe   History of Squamous Cell Carcinoma of the Skin - No evidence of recurrence today - No lymphadenopathy - Recommend regular full body skin exams - Recommend daily broad spectrum sunscreen SPF 30+ to sun-exposed areas, reapply every 2 hours as needed.  - Call if any new or changing lesions are noted between office visits - L medial cheek  Actinic Damage - diffuse scaly erythematous macules with  underlying dyspigmentation - Recommend daily broad spectrum sunscreen SPF 30+ to sun-exposed areas, reapply every 2 hours as needed.  - Call for new or changing lesions.  Return for As scheduled 05/31/21 for TBSE.  I, Othelia Pulling, RMA, am acting as scribe for Sarina Ser, MD .  Documentation: I have reviewed the above documentation for accuracy and completeness, and I agree with the above.  Sarina Ser, MD

## 2020-11-01 ENCOUNTER — Other Ambulatory Visit: Payer: Self-pay | Admitting: Family Medicine

## 2020-11-06 ENCOUNTER — Telehealth: Payer: Self-pay

## 2020-11-06 NOTE — Telephone Encounter (Signed)
It looks like he was referred in October to Dr Norma Fredrickson and has an appointment scheduled for this month    Copied from CRM [id number]. Topic: Quick Communication - See Telephone Encounter >> Nov 06, 2020 10:10 AM Aretta Nip wrote: CRM for notification. See Telephone encounter for: 11/06/20.Pt want FU from Nurse, states on Sch every 5 years for Colonoscopy, and been trying to get sch at hospital, suggested to pt  that would need an appt for specific referral and pt denied appt, does not feel necessary. FU with pt [phone]

## 2020-12-26 ENCOUNTER — Encounter: Payer: Self-pay | Admitting: Family Medicine

## 2020-12-26 LAB — HM COLONOSCOPY

## 2021-04-30 ENCOUNTER — Encounter: Payer: 59 | Admitting: Family Medicine

## 2021-05-14 ENCOUNTER — Other Ambulatory Visit: Payer: Self-pay

## 2021-05-14 ENCOUNTER — Encounter: Payer: Self-pay | Admitting: Family Medicine

## 2021-05-14 ENCOUNTER — Ambulatory Visit (INDEPENDENT_AMBULATORY_CARE_PROVIDER_SITE_OTHER): Payer: 59 | Admitting: Family Medicine

## 2021-05-14 VITALS — BP 110/72 | HR 62 | Ht 77.0 in | Wt 191.0 lb

## 2021-05-14 DIAGNOSIS — Z0000 Encounter for general adult medical examination without abnormal findings: Secondary | ICD-10-CM | POA: Diagnosis not present

## 2021-05-14 DIAGNOSIS — H8109 Meniere's disease, unspecified ear: Secondary | ICD-10-CM | POA: Diagnosis not present

## 2021-05-14 DIAGNOSIS — E790 Hyperuricemia without signs of inflammatory arthritis and tophaceous disease: Secondary | ICD-10-CM | POA: Diagnosis not present

## 2021-05-14 DIAGNOSIS — R739 Hyperglycemia, unspecified: Secondary | ICD-10-CM

## 2021-05-14 DIAGNOSIS — Z125 Encounter for screening for malignant neoplasm of prostate: Secondary | ICD-10-CM

## 2021-05-14 DIAGNOSIS — M7061 Trochanteric bursitis, right hip: Secondary | ICD-10-CM

## 2021-05-14 NOTE — Patient Instructions (Addendum)
Please review the attached list of medications and notify my office if there are any errors.   Please bring all of your medications to every appointment so we can make sure that our medication list is the same as yours.   Try taking OTC vitamin B12 1,000 mg every day to see if it helps with neuropathy. You can also try alpha-lipoic acid supplements 600mg  twice a day  The CDC recommends two doses of Shingrix (the shingles vaccine) separated by 2 to 6 months for adults age 61 years and older. I recommend checking with your insurance plan regarding coverage for this vaccine.

## 2021-05-14 NOTE — Progress Notes (Signed)
Complete physical exam   Patient: Paul Espinoza   DOB: Nov 28, 1960   60 y.o. Male  MRN: [id number] Visit Date: 05/14/2021  Today's healthcare provider: [name], MD   No chief complaint on file.  Subjective    Paul Espinoza is a 60 y.o. male who presents today for a complete physical exam.  He reports consuming a general diet.  Exercises some but has some trouble with hip pain.   He generally feels well. He reports sleeping well. He does have additional problems to discuss today.  HPI   Monitors sugar at home in low to mid 100s which correlates with dietary habits and exercise. Running is limited by pain along the side of his right hip. Also gets a little numbness in his toes.  Gout is pretty well controlled. Had a few minor flares last year and temporarily doubles the allopurinol, but is usually well controlled if he is careful with his diet.   Hctz continues to work well for controlling his Meniere's. Occasionally takes course of prednisone. .    Past Medical History:  Diagnosis Date   History of chicken pox    Squamous cell carcinoma of skin 01/31/2020   left medial cheek   Vertigo    Past Surgical History:  Procedure Laterality Date   COLONOSCOPY WITH PROPOFOL N/A 10/09/2015   Procedure: COLONOSCOPY WITH PROPOFOL;  Surgeon: Hulen Luster, MD;  Location: [facility] ENDOSCOPY;  Service: Gastroenterology;  Laterality: N/A;   Social History   Socioeconomic History   Marital status: Married    Spouse name: Not on file   Number of children: Not on file   Years of education: Not on file   Highest education level: Not on file  Occupational History    Comment: [employer]  Tobacco Use   Smoking status: Never   Smokeless tobacco: Never  Vaping Use   Vaping Use: Never used  Substance and Sexual Activity   Alcohol use: Yes    Comment: occasional   Drug use: Never   Sexual activity: Not on file  Other Topics Concern   Not on file   Social History Narrative   Not on file   Social Determinants of Health   Financial Resource Strain: Not on file  Food Insecurity: Not on file  Transportation Needs: Not on file  Physical Activity: Not on file  Stress: Not on file  Social Connections: Not on file  Intimate Partner Violence: Not on file   Family Status  Relation Name Status   Mother  Deceased   Father  Deceased at age 59   Brother  Alive   Brother  Alive   Brother  Alive   Family History  Problem Relation Age of Onset   Heart disease Mother    Breast cancer Mother    Colon polyps Mother    Colon cancer Father    Colon polyps Father    Heart disease Father    Colon cancer Brother    No Known Allergies  Patient Care Team: Birdie Sons, MD as PCP - General (Family Medicine) Ralene Bathe, MD (Dermatology) Carloyn Manner, MD as Referring Physician (Otolaryngology)   Medications: Outpatient Medications Prior to Visit  Medication Sig   allopurinol (ZYLOPRIM) 100 MG tablet Take 2 tablets (200 mg total) by mouth daily.   colchicine 0.6 MG tablet TAKE TWO TABLETS BY MOUTH ON FIRST DAY, THEN TAKE ONE TABLET BY MOUTH DAILY   fluticasone (FLONASE) 50  MCG/ACT nasal spray Place 2 sprays into both nostrils daily.   hydrochlorothiazide (HYDRODIURIL) 50 MG tablet Take 50 mg by mouth daily.   indomethacin (INDOCIN) 50 MG capsule TAKE ONE CAPSULE BY MOUTH THREE TIMES A DAY WITH FOOD AS NEEDED FOR GOUT FLARES   KLOR-CON M20 20 MEQ tablet Take 20 mEq by mouth daily.   Multiple Vitamin (MULTIVITAMIN) capsule Take 1 capsule by mouth daily.   predniSONE (DELTASONE) 10 MG tablet Take 10 mg by mouth once a week.    No facility-administered medications prior to visit.    Review of Systems  Constitutional:  Negative for appetite change, chills and fever.  Respiratory:  Negative for chest tightness, shortness of breath and wheezing.   Cardiovascular:  Negative for chest pain and palpitations.  Gastrointestinal:   Negative for abdominal pain, nausea and vomiting.     Objective    BP 110/72 (BP Location: Right Arm, Patient Position: Sitting, Cuff Size: Large)   Pulse 62   Ht 6\' 5"  (1.956 m)   Wt 191 lb (86.6 kg)   SpO2 100%   BMI 22.65 kg/m    Physical Exam   General Appearance:    Well developed, well nourished male. Alert, cooperative, in no acute distress, appears stated age  Head:    Normocephalic, without obvious abnormality, atraumatic  Eyes:    PERRL, conjunctiva/corneas clear, EOM's intact, fundi    benign, both eyes       Ears:    Normal TM's and external ear canals, both ears  Nose:   Nares normal, septum midline, mucosa normal, no drainage   or sinus tenderness  Back:     Symmetric, no curvature, ROM normal, no CVA tenderness  Lungs:     Clear to auscultation bilaterally, respirations unlabored  Chest wall:    No tenderness or deformity  Heart:    Normal heart rate. Normal rhythm. No murmurs, rubs, or gallops.  S1 and S2 normal  Abdomen:     Soft, non-tender, bowel sounds active all four quadrants,    no masses, no organomegaly  Genitalia:    deferred  Rectal:    deferred  Extremities:   All extremities are intact. No cyanosis or edema  Pulses:   2+ and symmetric all extremities  Skin:   Skin color, texture, turgor normal, no rashes or lesions  Lymph nodes:   Cervical, supraclavicular, and axillary nodes normal  Neurologic:   CNII-XII intact. Normal strength, sensation and reflexes      throughout     Last depression screening scores PHQ 2/9 Scores 05/14/2021 04/26/2020 04/16/2019  PHQ - 2 Score 0 0 0  PHQ- 9 Score 1 - 0   Last fall risk screening Fall Risk  05/14/2021  Falls in the past year? 0  Number falls in past yr: 0  Injury with Fall? 0  Risk for fall due to : No Fall Risks  Follow up Falls evaluation completed   Last Audit-C alcohol use screening Alcohol Use Disorder Test (AUDIT) 05/14/2021  1. How often do you have a drink containing alcohol? 4  2. How many  drinks containing alcohol do you have on a typical day when you are drinking? 0  3. How often do you have six or more drinks on one occasion? 0  AUDIT-C Score 4  4. How often during the last year have you found that you were not able to stop drinking once you had started? -  5. How often during the last year  have you failed to do what was normally expected from you because of drinking? -  6. How often during the last year have you needed a first drink in the morning to get yourself going after a heavy drinking session? -  7. How often during the last year have you had a feeling of guilt of remorse after drinking? -  8. How often during the last year have you been unable to remember what happened the night before because you had been drinking? -  9. Have you or someone else been injured as a result of your drinking? -  10. Has a relative or friend or a doctor or another health worker been concerned about your drinking or suggested you cut down? -  Alcohol Use Disorder Identification Test Final Score (AUDIT) -   A score of 3 or more in women, and 4 or more in men indicates increased risk for alcohol abuse, EXCEPT if all of the points are from question 1   No results found for any visits on 05/14/21.  Assessment & Plan    Routine Health Maintenance and Physical Exam  Exercise Activities and Dietary recommendations  Goals   None     Immunization History  Administered Date(s) Administered   Tdap 04/24/2010, 04/26/2020    Health Maintenance  Topic Date Due   COVID-19 Vaccine (1) Never done   Pneumococcal Vaccine 41-13 Years old (1 - PCV) Never done   Zoster Vaccines- Shingrix (1 of 2) Never done   HIV Screening  01/10/2027 (Originally [birth year]/25/1975)   INFLUENZA VACCINE  06/04/2021   COLONOSCOPY (Pts 45-85yrs Insurance coverage will need to be confirmed)  12/26/2025   TETANUS/TDAP  04/26/2030   Hepatitis C Screening  Completed   HPV VACCINES  Aged Out    Discussed health benefits of  physical activity, and encouraged him to engage in regular exercise appropriate for his age and condition.  1. Annual physical exam He states he has had four doses of Covid vaccine at [facility]. Recommended Shingrix which he is going to consider.  - EKG 12-Lead  2. Hyperglycemia  - Comprehensive metabolic panel - Hemoglobin A1c - EKG 12-Lead  3. Meniere's disease, unspecified laterality Well controlled.  . - Comprehensive metabolic panel - EKG 24-MQKM  4. Hyperuricemia Doing well current dose of allopurinol.  - Uric acid  5. Prostate cancer screening  - PSA Total (Reflex To Free) ([facility] only)  6. Trochanteric bursitis of right hip Exacerbated by running. Encourage regular application of ice. Consider PT if continues to be limit exercise.       The entirety of the information documented in the History of Present Illness, Review of Systems and Physical Exam were personally obtained by me. Portions of this information were initially documented by the CMA and reviewed by me for thoroughness and accuracy.     [name], MD  [facility] [phone] (phone) [phone] (fax)  [place]

## 2021-05-15 LAB — COMPREHENSIVE METABOLIC PANEL
ALT: 19 IU/L (ref 0–44)
AST: 17 IU/L (ref 0–40)
Albumin/Globulin Ratio: 2.9 — ABNORMAL HIGH (ref 1.2–2.2)
Albumin: 4.6 g/dL (ref 3.8–4.9)
Alkaline Phosphatase: 69 IU/L (ref 44–121)
BUN/Creatinine Ratio: 21 (ref 10–24)
BUN: 22 mg/dL (ref 8–27)
Bilirubin Total: 0.5 mg/dL (ref 0.0–1.2)
CO2: 27 mmol/L (ref 20–29)
Calcium: 9.4 mg/dL (ref 8.6–10.2)
Chloride: 97 mmol/L (ref 96–106)
Creatinine, Ser: 1.05 mg/dL (ref 0.76–1.27)
Globulin, Total: 1.6 g/dL (ref 1.5–4.5)
Glucose: 108 mg/dL — ABNORMAL HIGH (ref 65–99)
Potassium: 4.3 mmol/L (ref 3.5–5.2)
Sodium: 140 mmol/L (ref 134–144)
Total Protein: 6.2 g/dL (ref 6.0–8.5)
eGFR: 81 mL/min/{1.73_m2} (ref >59)

## 2021-05-15 LAB — PSA TOTAL (REFLEX TO FREE): Prostate Specific Ag, Serum: 0.5 ng/mL (ref 0.0–4.0)

## 2021-05-15 LAB — URIC ACID: Uric Acid: 6.9 mg/dL (ref 3.8–8.4)

## 2021-05-15 LAB — HEMOGLOBIN A1C
Est. average glucose Bld gHb Est-mCnc: 114 mg/dL
Hgb A1c MFr Bld: 5.6 % (ref 4.8–5.6)

## 2021-05-31 ENCOUNTER — Ambulatory Visit: Payer: 59 | Admitting: Dermatology

## 2021-05-31 ENCOUNTER — Other Ambulatory Visit: Payer: Self-pay

## 2021-05-31 DIAGNOSIS — Z1283 Encounter for screening for malignant neoplasm of skin: Secondary | ICD-10-CM

## 2021-05-31 DIAGNOSIS — L814 Other melanin hyperpigmentation: Secondary | ICD-10-CM

## 2021-05-31 DIAGNOSIS — L820 Inflamed seborrheic keratosis: Secondary | ICD-10-CM

## 2021-05-31 DIAGNOSIS — L821 Other seborrheic keratosis: Secondary | ICD-10-CM

## 2021-05-31 DIAGNOSIS — D1800 Hemangioma unspecified site: Secondary | ICD-10-CM

## 2021-05-31 DIAGNOSIS — L578 Other skin changes due to chronic exposure to nonionizing radiation: Secondary | ICD-10-CM

## 2021-05-31 DIAGNOSIS — L570 Actinic keratosis: Secondary | ICD-10-CM

## 2021-05-31 DIAGNOSIS — D229 Melanocytic nevi, unspecified: Secondary | ICD-10-CM

## 2021-05-31 NOTE — Patient Instructions (Signed)

## 2021-05-31 NOTE — Progress Notes (Signed)
Follow-Up Visit   Subjective  Paul Espinoza is a 60 y.o. male who presents for the following: Annual Exam (Mole check ). Yearly mole check. Hx of SCC. Pt c/o irritated growth on the left forearm.  The patient presents for Total-Body Skin Exam (TBSE) for skin cancer screening and mole check.   The following portions of the chart were reviewed this encounter and updated as appropriate:   Tobacco  Allergies  Meds  Problems  Med Hx  Surg Hx  Fam Hx     Review of Systems:  No other skin or systemic complaints except as noted in HPI or Assessment and Plan.  Objective  Well appearing patient in no apparent distress; mood and affect are within normal limits.  A full examination was performed including scalp, head, eyes, ears, nose, lips, neck, chest, axillae, abdomen, back, buttocks, bilateral upper extremities, bilateral lower extremities, hands, feet, fingers, toes, fingernails, and toenails. All findings within normal limits unless otherwise noted below.  right distal nose, anterior scalp (2) Erythematous thin papules/macules with gritty scale, pigmented  Left Forearm - Anterior Erythematous keratotic or waxy stuck-on papule or plaque.   left forearm, right dorsum hand (4) Stuck-on, waxy, tan-brown papule or plaque --Discussed benign etiology and prognosis.    Assessment & Plan  AK (actinic keratosis) (2) right distal nose, anterior scalp  Actinic keratoses are precancerous spots that appear secondary to cumulative UV radiation exposure/sun exposure over time. They are chronic with expected duration over 1 year. A portion of actinic keratoses will progress to squamous cell carcinoma of the skin. It is not possible to reliably predict which spots will progress to skin cancer and so treatment is recommended to prevent development of skin cancer.  Recommend daily broad spectrum sunscreen SPF 30+ to sun-exposed areas, reapply every 2 hours as needed.  Recommend staying in the  shade or wearing long sleeves, sun glasses (UVA+UVB protection) and wide brim hats (4-inch brim around the entire circumference of the hat). Call for new or changing lesions.   Recheck at follow up visit   Destruction of lesion - right distal nose, anterior scalp Complexity: simple   Destruction method: cryotherapy   Informed consent: discussed and consent obtained   Timeout:  patient name, date of birth, surgical site, and procedure verified Lesion destroyed using liquid nitrogen: Yes   Region frozen until ice ball extended beyond lesion: Yes   Outcome: patient tolerated procedure well with no complications   Post-procedure details: wound care instructions given    Inflamed seborrheic keratosis Left Forearm - Anterior  Destruction of lesion - Left Forearm - Anterior Complexity: simple   Destruction method: cryotherapy   Informed consent: discussed and consent obtained   Timeout:  patient name, date of birth, surgical site, and procedure verified Lesion destroyed using liquid nitrogen: Yes   Region frozen until ice ball extended beyond lesion: Yes   Outcome: patient tolerated procedure well with no complications   Post-procedure details: wound care instructions given    Seborrheic keratosis (4) left forearm, right dorsum hand  Pt will pay out of pocket to remove benign Sk's today x 4  Destruction of lesion - left forearm, right dorsum hand Complexity: simple   Destruction method: cryotherapy   Informed consent: discussed and consent obtained   Timeout:  patient name, date of birth, surgical site, and procedure verified Lesion destroyed using liquid nitrogen: Yes   Region frozen until ice ball extended beyond lesion: Yes   Outcome: patient tolerated procedure  well with no complications   Post-procedure details: wound care instructions given    Skin cancer screening  Lentigines - Scattered tan macules - Due to sun exposure - Benign-appering, observe - Recommend daily  broad spectrum sunscreen SPF 30+ to sun-exposed areas, reapply every 2 hours as needed. - Call for any changes  Seborrheic Keratoses - Stuck-on, waxy, tan-brown papules and/or plaques  - Benign-appearing - Discussed benign etiology and prognosis. - Observe - Call for any changes  Melanocytic Nevi - Tan-brown and/or pink-flesh-colored symmetric macules and papules - Benign appearing on exam today - Observation - Call clinic for new or changing moles - Recommend daily use of broad spectrum spf 30+ sunscreen to sun-exposed areas.   Hemangiomas - Red papules - Discussed benign nature - Observe - Call for any changes  Actinic Damage - Chronic condition, secondary to cumulative UV/sun exposure - diffuse scaly erythematous macules with underlying dyspigmentation - Recommend daily broad spectrum sunscreen SPF 30+ to sun-exposed areas, reapply every 2 hours as needed.  - Staying in the shade or wearing long sleeves, sun glasses (UVA+UVB protection) and wide brim hats (4-inch brim around the entire circumference of the hat) are also recommended for sun protection.  - Call for new or changing lesions.  Skin cancer screening performed today.   Return in about 6 months (around 12/01/2021) for AKs, ISK's .  IMarye Round, CMA, am acting as scribe for Sarina Ser, MD .  Documentation: I have reviewed the above documentation for accuracy and completeness, and I agree with the above.  Sarina Ser, MD

## 2021-06-05 ENCOUNTER — Encounter: Payer: Self-pay | Admitting: Dermatology

## 2021-07-27 ENCOUNTER — Telehealth: Payer: Self-pay

## 2021-07-27 DIAGNOSIS — R200 Anesthesia of skin: Secondary | ICD-10-CM

## 2021-07-27 NOTE — Telephone Encounter (Signed)
Copied from [place] [phone]. Topic: Referral - Request for Referral >> Jul 27, 2021  9:53 AM Paul Espinoza wrote: Has patient seen PCP for this complaint? yes *If NO, is insurance requiring patient see PCP for this issue before PCP can refer them? 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

## 2022-05-12 ENCOUNTER — Other Ambulatory Visit: Payer: Self-pay | Admitting: Family Medicine

## 2022-05-14 NOTE — Progress Notes (Signed)
I,Paul Espinoza,acting as a scribe for Paul Huh, MD.,have documented all relevant documentation on the behalf of Paul Huh, MD,as directed by  Paul Huh, MD while in the presence of Paul Huh, MD.    Complete physical exam   Patient: Paul Espinoza   DOB: 05-16-1961   61 y.o. Male  MRN: [id number] Visit Date: 05/15/2022  Today's healthcare provider: [name], MD   Chief Complaint  Patient presents with   Annual Exam   Hyperglycemia   Subjective    Rashard Ryle Espinoza is a 61 y.o. male who presents today for a complete physical exam.  He reports consuming a general diet. Home exercise routine includes running and walking. He generally feels fairly well. He reports sleeping fairly well. He does not have additional problems to discuss today.  HPI  Hyperuricemia: Stable on Allopurinol  Hyperglycemia, Follow-up  Lab Results  Component Value Date   HGBA1C 5.6 05/14/2021   HGBA1C 5.6 08/29/2020   HGBA1C 6.4 (A) 05/25/2020   GLUCOSE 108 (H) 05/14/2021   GLUCOSE 132 (H) 04/26/2020   GLUCOSE 113 (H) 04/16/2019    Last seen for for this 1  year  ago.  Management since that visit includes lifestyle modifications. Current symptoms include none and have been stable.  Prior visit with dietician: no Current diet: well balanced Current exercise: running/ jogging and walking  Pertinent Labs:    Component Value Date/Time   CHOL 170 04/26/2020 0914   TRIG 129 04/26/2020 0914   CHOLHDL 5.2 (H) 04/26/2020 0914   CREATININE 1.05 05/14/2021 1049    Wt Readings from Last 3 Encounters:  05/15/22 194 lb (88 kg)  05/14/21 191 lb (86.6 kg)  08/29/20 195 lb (88.5 kg)    -----------------------------------------------------------------------------------------    Past Medical History:  Diagnosis Date   History of chicken pox    Squamous cell carcinoma of skin 01/31/2020   left medial cheek   Vertigo    Past Surgical History:  Procedure  Laterality Date   COLONOSCOPY WITH PROPOFOL N/A 10/09/2015   Procedure: COLONOSCOPY WITH PROPOFOL;  Surgeon: Hulen Luster, MD;  Location: [facility] ENDOSCOPY;  Service: Gastroenterology;  Laterality: N/A;   Social History   Socioeconomic History   Marital status: Married    Spouse name: Not on file   Number of children: Not on file   Years of education: Not on file   Highest education level: Not on file  Occupational History    Comment: [employer]  Tobacco Use   Smoking status: Never   Smokeless tobacco: Never  Vaping Use   Vaping Use: Never used  Substance and Sexual Activity   Alcohol use: Yes    Comment: occasional   Drug use: Never   Sexual activity: Not on file  Other Topics Concern   Not on file  Social History Narrative   Not on file   Social Determinants of Health   Financial Resource Strain: Not on file  Food Insecurity: Not on file  Transportation Needs: Not on file  Physical Activity: Not on file  Stress: Not on file  Social Connections: Not on file  Intimate Partner Violence: Not on file   Family Status  Relation Name Status   Mother  Deceased   Father  Deceased at age 51   Brother  Alive   Brother  Alive   Brother  Alive   Family History  Problem Relation Age of Onset   Heart disease Mother  Breast cancer Mother    Colon polyps Mother    Colon cancer Father    Colon polyps Father    Heart disease Father    Colon cancer Brother    No Known Allergies  Patient Care Team: Birdie Sons, MD as PCP - General (Family Medicine) Ralene Bathe, MD (Dermatology) Carloyn Manner, MD as Referring Physician (Otolaryngology) Efrain Sella, MD as Consulting Physician (Gastroenterology)   Medications: Outpatient Medications Prior to Visit  Medication Sig   allopurinol (ZYLOPRIM) 100 MG tablet TAKE TWO TABLETS BY MOUTH DAILY   colchicine 0.6 MG tablet TAKE TWO TABLETS BY MOUTH ON FIRST DAY, THEN TAKE ONE TABLET BY MOUTH  DAILY   fluticasone (FLONASE) 50 MCG/ACT nasal spray Place 2 sprays into both nostrils daily.   hydrochlorothiazide (HYDRODIURIL) 50 MG tablet Take 50 mg by mouth daily.   indomethacin (INDOCIN) 50 MG capsule TAKE ONE CAPSULE BY MOUTH THREE TIMES A DAY WITH FOOD AS NEEDED FOR GOUT FLARES   KLOR-CON M20 20 MEQ tablet Take 20 mEq by mouth daily.   Multiple Vitamin (MULTIVITAMIN) capsule Take 1 capsule by mouth daily.   predniSONE (DELTASONE) 10 MG tablet Take 10 mg by mouth 2 (two) times a week.   No facility-administered medications prior to visit.    Review of Systems  Constitutional:  Negative for appetite change, chills, fatigue and fever.  HENT:  Positive for hearing loss and tinnitus. Negative for congestion, ear pain, nosebleeds and trouble swallowing.   Eyes:  Negative for pain and visual disturbance.  Respiratory:  Negative for cough, chest tightness, shortness of breath and wheezing.   Cardiovascular:  Negative for chest pain, palpitations and leg swelling.  Gastrointestinal:  Negative for abdominal pain, blood in stool, constipation, diarrhea, nausea and vomiting.  Endocrine: Negative for polydipsia, polyphagia and polyuria.  Genitourinary:  Negative for dysuria and flank pain.  Musculoskeletal:  Negative for arthralgias, back pain, joint swelling, myalgias and neck stiffness.  Skin:  Negative for color change, rash and wound.  Neurological:  Positive for numbness. Negative for dizziness, tremors, seizures, speech difficulty, weakness, light-headedness and headaches.  Psychiatric/Behavioral:  Negative for behavioral problems, confusion, decreased concentration, dysphoric mood and sleep disturbance. The patient is not nervous/anxious.   All other systems reviewed and are negative.     Objective     BP 119/66 (BP Location: Left Arm, Patient Position: Sitting, Cuff Size: Large)   Pulse 60   Temp 98.4 F (36.9 C) (Oral)   Resp 14   Ht '6\' 5"'$  (1.956 m)   Wt 194 lb (88 kg)    SpO2 98% Comment: room air  BMI 23.01 kg/m    Physical Exam   General Appearance:    Well developed, well nourished male. Alert, cooperative, in no acute distress, appears stated age  Head:    Normocephalic, without obvious abnormality, atraumatic  Eyes:    PERRL, conjunctiva/corneas clear, EOM's intact, fundi    benign, both eyes       Ears:    Normal TM's and external ear canals, both ears. Wears bilateral hearing aids.   Nose:   Nares normal, septum midline, mucosa normal, no drainage   or sinus tenderness  Throat:   Lips, mucosa, and tongue normal; teeth and gums normal  Neck:   Supple, symmetrical, trachea midline, no adenopathy;       thyroid:  No enlargement/tenderness/nodules; no carotid   bruit or JVD  Back:     Symmetric, no curvature, ROM normal, no  CVA tenderness  Lungs:     Clear to auscultation bilaterally, respirations unlabored  Chest wall:    No tenderness or deformity  Heart:    Normal heart rate. Normal rhythm. No murmurs, rubs, or gallops.  S1 and S2 normal  Abdomen:     Soft, non-tender, bowel sounds active all four quadrants,    no masses, no organomegaly  Genitalia:    deferred  Rectal:    deferred  Extremities:   All extremities are intact. No cyanosis or edema  Pulses:   2+ and symmetric all extremities  Skin:   Skin color, texture, turgor normal, no rashes or lesions  Lymph nodes:   Cervical, supraclavicular, and axillary nodes normal  Neurologic:   CNII-XII intact. Normal strength, sensation and reflexes      throughout     Last depression screening scores    05/15/2022    9:03 AM 05/14/2021   10:09 AM 04/26/2020    8:17 AM  PHQ 2/9 Scores  PHQ - 2 Score 0 0 0  PHQ- 9 Score 0 1    Last fall risk screening    05/15/2022    9:04 AM  Fall Risk   Falls in the past year? 0  Number falls in past yr: 0  Injury with Fall? 0  Risk for fall due to : No Fall Risks  Follow up Falls evaluation completed   Last Audit-C alcohol use screening     05/14/2021   10:10 AM  Alcohol Use Disorder Test (AUDIT)  1. How often do you have a drink containing alcohol? 4  2. How many drinks containing alcohol do you have on a typical day when you are drinking? 0  3. How often do you have six or more drinks on one occasion? 0  AUDIT-C Score 4   A score of 3 or more in women, and 4 or more in men indicates increased risk for alcohol abuse, EXCEPT if all of the points are from question 1   No results found for any visits on 05/15/22.  Assessment & Plan    Routine Health Maintenance and Physical Exam  Exercise Activities and Dietary recommendations  Goals   None     Immunization History  Administered Date(s) Administered   Tdap 04/24/2010, 04/26/2020    Health Maintenance  Topic Date Due   COVID-19 Vaccine (1) Never done   Zoster Vaccines- Shingrix (1 of 2) Never done   HIV Screening  01/10/2027 (Originally 05/25/1976)   INFLUENZA VACCINE  06/04/2022   COLONOSCOPY (Pts 45-43yr Insurance coverage will need to be confirmed)  12/26/2025   TETANUS/TDAP  04/26/2030   Hepatitis C Screening  Completed   HPV VACCINES  Aged Out    Discussed health benefits of physical activity, and encouraged him to engage in regular exercise appropriate for his age and condition.   - CBC - Comprehensive metabolic panel - Lipid panel  2. Prostate cancer screening  - PSA Total (Reflex To Free) ([facility] only)  3. Hyperglycemia  - Hemoglobin A1c  4. Hyperuricemia Doing well on current dose of allopurinol requires medications for acute flare once or twice a day which is refilled today.   - colchicine 0.6 MG tablet; TAKE TWO TABLETS BY MOUTH ON FIRST DAY, THEN TAKE ONE TABLET BY MOUTH DAILY  Dispense: 30 tablet; Refill: 3 - indomethacin (INDOCIN) 50 MG capsule; TAKE ONE CAPSULE BY MOUTH THREE TIMES A DAY WITH FOOD AS NEEDED FOR GOUT FLARES  Dispense: 30 capsule; Refill:  3 - Uric acid  5. Wears hearing aid   6. Meniere's disease, unspecified  laterality Followed by ENT  7. Polyneuropathy Negative work up per Dr. Manuella Ghazi     The entirety of the information documented in the History of Present Illness, Review of Systems and Physical Exam were personally obtained by me. Portions of this information were initially documented by the CMA and reviewed by me for thoroughness and accuracy.     Paul Huh, MD  [facility] [phone] (phone) [phone] (fax)  [place]

## 2022-05-15 ENCOUNTER — Encounter: Payer: Self-pay | Admitting: Family Medicine

## 2022-05-15 ENCOUNTER — Ambulatory Visit (INDEPENDENT_AMBULATORY_CARE_PROVIDER_SITE_OTHER): Payer: 59 | Admitting: Family Medicine

## 2022-05-15 VITALS — BP 119/66 | HR 60 | Temp 98.4°F | Resp 14 | Ht 77.0 in | Wt 194.0 lb

## 2022-05-15 DIAGNOSIS — E790 Hyperuricemia without signs of inflammatory arthritis and tophaceous disease: Secondary | ICD-10-CM | POA: Diagnosis not present

## 2022-05-15 DIAGNOSIS — Z125 Encounter for screening for malignant neoplasm of prostate: Secondary | ICD-10-CM | POA: Diagnosis not present

## 2022-05-15 DIAGNOSIS — G629 Polyneuropathy, unspecified: Secondary | ICD-10-CM

## 2022-05-15 DIAGNOSIS — R739 Hyperglycemia, unspecified: Secondary | ICD-10-CM | POA: Diagnosis not present

## 2022-05-15 DIAGNOSIS — Z0000 Encounter for general adult medical examination without abnormal findings: Secondary | ICD-10-CM | POA: Diagnosis not present

## 2022-05-15 DIAGNOSIS — H8109 Meniere's disease, unspecified ear: Secondary | ICD-10-CM

## 2022-05-15 DIAGNOSIS — Z974 Presence of external hearing-aid: Secondary | ICD-10-CM

## 2022-05-15 MED ORDER — INDOMETHACIN 50 MG PO CAPS: ORAL_CAPSULE | ORAL | 3 refills | Status: AC

## 2022-05-15 MED ORDER — COLCHICINE 0.6 MG PO TABS: ORAL_TABLET | ORAL | 3 refills | Status: AC

## 2022-05-15 NOTE — Patient Instructions (Signed)
Please review the attached list of medications and notify my office if there are any errors.   The CDC recommends two doses of Shingrix (the shingles vaccine) separated by 2 to 6 months for adults age 62 years and older. I recommend checking with your insurance plan regarding coverage for this vaccine.   

## 2022-05-16 LAB — CBC
Hematocrit: 43.2 % (ref 37.5–51.0)
Hemoglobin: 15.2 g/dL (ref 13.0–17.7)
MCH: 33 pg (ref 26.6–33.0)
MCHC: 35.2 g/dL (ref 31.5–35.7)
MCV: 94 fL (ref 79–97)
Platelets: 157 10*3/uL (ref 150–450)
RBC: 4.61 x10E6/uL (ref 4.14–5.80)
RDW: 12.7 % (ref 11.6–15.4)
WBC: 6.7 10*3/uL (ref 3.4–10.8)

## 2022-05-16 LAB — COMPREHENSIVE METABOLIC PANEL
ALT: 15 IU/L (ref 0–44)
AST: 15 IU/L (ref 0–40)
Albumin/Globulin Ratio: 1.8 (ref 1.2–2.2)
Albumin: 4 g/dL (ref 3.9–4.9)
Alkaline Phosphatase: 56 IU/L (ref 44–121)
BUN/Creatinine Ratio: 16 (ref 10–24)
BUN: 16 mg/dL (ref 8–27)
Bilirubin Total: 0.6 mg/dL (ref 0.0–1.2)
CO2: 29 mmol/L (ref 20–29)
Calcium: 9.1 mg/dL (ref 8.6–10.2)
Chloride: 97 mmol/L (ref 96–106)
Creatinine, Ser: 1.03 mg/dL (ref 0.76–1.27)
Globulin, Total: 2.2 g/dL (ref 1.5–4.5)
Glucose: 129 mg/dL — ABNORMAL HIGH (ref 70–99)
Potassium: 3.8 mmol/L (ref 3.5–5.2)
Sodium: 138 mmol/L (ref 134–144)
Total Protein: 6.2 g/dL (ref 6.0–8.5)
eGFR: 83 mL/min/{1.73_m2} (ref >59)

## 2022-05-16 LAB — HEMOGLOBIN A1C
Est. average glucose Bld gHb Est-mCnc: 120 mg/dL
Hgb A1c MFr Bld: 5.8 % — ABNORMAL HIGH (ref 4.8–5.6)

## 2022-05-16 LAB — URIC ACID: Uric Acid: 6.9 mg/dL (ref 3.8–8.4)

## 2022-05-16 LAB — LIPID PANEL
Chol/HDL Ratio: 4.8 ratio (ref 0.0–5.0)
Cholesterol, Total: 195 mg/dL (ref 100–199)
HDL: 41 mg/dL (ref >39)
LDL Chol Calc (NIH): 133 mg/dL — ABNORMAL HIGH (ref 0–99)
Triglycerides: 116 mg/dL (ref 0–149)
VLDL Cholesterol Cal: 21 mg/dL (ref 5–40)

## 2022-05-16 LAB — PSA TOTAL (REFLEX TO FREE): Prostate Specific Ag, Serum: 0.6 ng/mL (ref 0.0–4.0)

## 2022-06-03 ENCOUNTER — Encounter: Payer: Self-pay | Admitting: Dermatology

## 2022-06-03 ENCOUNTER — Ambulatory Visit: Payer: 59 | Admitting: Dermatology

## 2022-06-03 DIAGNOSIS — L738 Other specified follicular disorders: Secondary | ICD-10-CM

## 2022-06-03 DIAGNOSIS — L578 Other skin changes due to chronic exposure to nonionizing radiation: Secondary | ICD-10-CM

## 2022-06-03 DIAGNOSIS — Z1283 Encounter for screening for malignant neoplasm of skin: Secondary | ICD-10-CM

## 2022-06-03 DIAGNOSIS — L570 Actinic keratosis: Secondary | ICD-10-CM

## 2022-06-03 DIAGNOSIS — Z85828 Personal history of other malignant neoplasm of skin: Secondary | ICD-10-CM | POA: Diagnosis not present

## 2022-06-03 DIAGNOSIS — B078 Other viral warts: Secondary | ICD-10-CM

## 2022-06-03 DIAGNOSIS — L814 Other melanin hyperpigmentation: Secondary | ICD-10-CM

## 2022-06-03 DIAGNOSIS — D1800 Hemangioma unspecified site: Secondary | ICD-10-CM

## 2022-06-03 DIAGNOSIS — D229 Melanocytic nevi, unspecified: Secondary | ICD-10-CM

## 2022-06-03 DIAGNOSIS — L820 Inflamed seborrheic keratosis: Secondary | ICD-10-CM | POA: Diagnosis not present

## 2022-06-03 DIAGNOSIS — L821 Other seborrheic keratosis: Secondary | ICD-10-CM

## 2022-06-03 NOTE — Progress Notes (Signed)
Follow-Up Visit   Subjective  Paul Espinoza is a 61 y.o. male who presents for the following: Annual Exam (Here for skin cancer screening. Full body. Hx of SCC, Hx of AK's. Several areas of concern today). The patient presents for Total-Body Skin Exam (TBSE) for skin cancer screening and mole check.  The patient has spots, moles and lesions to be evaluated, some may be new or changing and the patient has concerns that these could be cancer.  The following portions of the chart were reviewed this encounter and updated as appropriate:  Tobacco  Allergies  Meds  Problems  Med Hx  Surg Hx  Fam Hx     Review of Systems: No other skin or systemic complaints except as noted in HPI or Assessment and Plan.  Objective  Well appearing patient in no apparent distress; mood and affect are within normal limits.  A full examination was performed including scalp, head, eyes, ears, nose, lips, neck, chest, axillae, abdomen, back, buttocks, bilateral upper extremities, bilateral lower extremities, hands, feet, fingers, toes, fingernails, and toenails. All findings within normal limits unless otherwise noted below.  Right Lower Vermilion Lip x1 Erythematous thin papules/macules with gritty scale.   Left Hand x1, left base of thumb x1 (2) Verrucous papules -- Discussed viral etiology and contagion.   Right Lower Leg - Anterior x1, right middle finger x1 (2) Erythematous keratotic or waxy stuck-on papule or plaque.   Assessment & Plan   History of Squamous Cell Carcinoma of the Skin. Left medial cheek. 01/31/2020. - No evidence of recurrence today - No lymphadenopathy - Recommend regular full body skin exams - Recommend daily broad spectrum sunscreen SPF 30+ to sun-exposed areas, reapply every 2 hours as needed.  - Call if any new or changing lesions are noted between office visits  Lentigines - Scattered tan macules - Due to sun exposure - Benign-appearing, observe - Recommend daily  broad spectrum sunscreen SPF 30+ to sun-exposed areas, reapply every 2 hours as needed. - Call for any changes  Seborrheic Keratoses - Stuck-on, waxy, tan-brown papules and/or plaques  - Benign-appearing - Discussed benign etiology and prognosis. - Observe - Call for any changes  Melanocytic Nevi - Tan-brown and/or pink-flesh-colored symmetric macules and papules - Benign appearing on exam today - Observation - Call clinic for new or changing moles - Recommend daily use of broad spectrum spf 30+ sunscreen to sun-exposed areas.   Hemangiomas - Red papules - Discussed benign nature - Observe - Call for any changes  Actinic Damage - Chronic condition, secondary to cumulative UV/sun exposure - diffuse scaly erythematous macules with underlying dyspigmentation - Recommend daily broad spectrum sunscreen SPF 30+ to sun-exposed areas, reapply every 2 hours as needed.  - Staying in the shade or wearing long sleeves, sun glasses (UVA+UVB protection) and wide brim hats (4-inch brim around the entire circumference of the hat) are also recommended for sun protection.  - Call for new or changing lesions.  Skin cancer screening performed today.  Sebaceous Hyperplasia - Small yellow papules with a central dell - Benign - Observe  AK (actinic keratosis) Right Lower Vermilion Lip x1 Actinic keratoses are precancerous spots that appear secondary to cumulative UV radiation exposure/sun exposure over time. They are chronic with expected duration over 1 year. A portion of actinic keratoses will progress to squamous cell carcinoma of the skin. It is not possible to reliably predict which spots will progress to skin cancer and so treatment is recommended to prevent development  of skin cancer.  Recommend daily broad spectrum sunscreen SPF 30+ to sun-exposed areas, reapply every 2 hours as needed.  Recommend staying in the shade or wearing long sleeves, sun glasses (UVA+UVB protection) and wide brim  hats (4-inch brim around the entire circumference of the hat). Call for new or changing lesions.  Destruction of lesion - Right Lower Vermilion Lip x1 Complexity: simple   Destruction method: cryotherapy   Informed consent: discussed and consent obtained   Timeout:  patient name, date of birth, surgical site, and procedure verified Lesion destroyed using liquid nitrogen: Yes   Region frozen until ice ball extended beyond lesion: Yes   Outcome: patient tolerated procedure well with no complications   Post-procedure details: wound care instructions given   Additional details:  Prior to procedure, discussed risks of blister formation, small wound, skin dyspigmentation, or rare scar following cryotherapy. Recommend Vaseline ointment to treated areas while healing.  Other viral warts (2) Left Hand x1, left base of thumb x1 Discussed viral etiology and risk of spread.  Discussed multiple treatments may be required to clear warts.  Discussed possible post-treatment dyspigmentation and risk of recurrence.  Destruction of lesion - Left Hand x1, left base of thumb x1 Complexity: simple   Destruction method: cryotherapy   Informed consent: discussed and consent obtained   Timeout:  patient name, date of birth, surgical site, and procedure verified Lesion destroyed using liquid nitrogen: Yes   Region frozen until ice ball extended beyond lesion: Yes   Outcome: patient tolerated procedure well with no complications   Post-procedure details: wound care instructions given   Additional details:  Prior to procedure, discussed risks of blister formation, small wound, skin dyspigmentation, or rare scar following cryotherapy. Recommend Vaseline ointment to treated areas while healing.  Inflamed seborrheic keratosis (2) Right Lower Leg - Anterior x1, right middle finger x1 Symptomatic, irritating, patient would like treated. Destruction of lesion - Right Lower Leg - Anterior x1, right middle finger  x1 Complexity: simple   Destruction method: cryotherapy   Informed consent: discussed and consent obtained   Timeout:  patient name, date of birth, surgical site, and procedure verified Lesion destroyed using liquid nitrogen: Yes   Region frozen until ice ball extended beyond lesion: Yes   Outcome: patient tolerated procedure well with no complications   Post-procedure details: wound care instructions given   Additional details:  Prior to procedure, discussed risks of blister formation, small wound, skin dyspigmentation, or rare scar following cryotherapy. Recommend Vaseline ointment to treated areas while healing.  Return in about 1 year (around 06/04/2023) for TBSE, HxSCC.  I, Emelia Salisbury, CMA, am acting as scribe for Sarina Ser, MD. Documentation: I have reviewed the above documentation for accuracy and completeness, and I agree with the above.  Sarina Ser, MD

## 2022-06-03 NOTE — Patient Instructions (Addendum)
Cryotherapy Aftercare  Wash gently with soap and water everyday.   Apply Vaseline daily until healed.    Recommend daily broad spectrum sunscreen SPF 30+ to sun-exposed areas, reapply every 2 hours as needed. Call for new or changing lesions.  Staying in the shade or wearing long sleeves, sun glasses (UVA+UVB protection) and wide brim hats (4-inch brim around the entire circumference of the hat) are also recommended for sun protection.    Melanoma ABCDEs  Melanoma is the most dangerous type of skin cancer, and is the leading cause of death from skin disease.  You are more likely to develop melanoma if you: Have light-colored skin, light-colored eyes, or red or blond hair Spend a lot of time in the sun Tan regularly, either outdoors or in a tanning bed Have had blistering sunburns, especially during childhood Have a close family member who has had a melanoma Have atypical moles or large birthmarks  Early detection of melanoma is key since treatment is typically straightforward and cure rates are extremely high if we catch it early.   The first sign of melanoma is often a change in a mole or a new dark spot.  The ABCDE system is a way of remembering the signs of melanoma.  A for asymmetry:  The two halves do not match. B for border:  The edges of the growth are irregular. C for color:  A mixture of colors are present instead of an even brown color. D for diameter:  Melanomas are usually (but not always) greater than 6mm - the size of a pencil eraser. E for evolution:  The spot keeps changing in size, shape, and color.  Please check your skin once per month between visits. You can use a small mirror in front and a large mirror behind you to keep an eye on the back side or your body.   If you see any new or changing lesions before your next follow-up, please call to schedule a visit.  Please continue daily skin protection including broad spectrum sunscreen SPF 30+ to sun-exposed areas,  reapplying every 2 hours as needed when you're outdoors.   Staying in the shade or wearing long sleeves, sun glasses (UVA+UVB protection) and wide brim hats (4-inch brim around the entire circumference of the hat) are also recommended for sun protection.       Due to recent changes in healthcare laws, you may see results of your pathology and/or laboratory studies on MyChart before the doctors have had a chance to review them. We understand that in some cases there may be results that are confusing or concerning to you. Please understand that not all results are received at the same time and often the doctors may need to interpret multiple results in order to provide you with the best plan of care or course of treatment. Therefore, we ask that you please give us 2 business days to thoroughly review all your results before contacting the office for clarification. Should we see a critical lab result, you will be contacted sooner.   If You Need Anything After Your Visit  If you have any questions or concerns for your doctor, please call our main line at [phone] and press option 4 to reach your doctor's medical assistant. If no one answers, please leave a voicemail as directed and we will return your call as soon as possible. Messages left after 4 pm will be answered the following business day.   You may also send us a message   via MyChart. We typically respond to MyChart messages within 1-2 business days.  For prescription refills, please ask your pharmacy to contact our office. Our fax number is [phone].  If you have an urgent issue when the clinic is closed that cannot wait until the next business day, you can page your doctor at the number below.    Please note that while we do our best to be available for urgent issues outside of office hours, we are not available 24/7.   If you have an urgent issue and are unable to reach us, you may choose to seek medical care at your doctor's  office, retail clinic, urgent care center, or emergency room.  If you have a medical emergency, please immediately call 911 or go to the emergency department.  Pager Numbers  - Dr. Kowalski: [phone]  - Dr. Moye: [phone]  - Dr. Stewart: [phone]  In the event of inclement weather, please call our main line at [phone] for an update on the status of any delays or closures.  Dermatology Medication Tips: Please keep the boxes that topical medications come in in order to help keep track of the instructions about where and how to use these. Pharmacies typically print the medication instructions only on the boxes and not directly on the medication tubes.   If your medication is too expensive, please contact our office at [phone] option 4 or send us a message through MyChart.   We are unable to tell what your co-pay for medications will be in advance as this is different depending on your insurance coverage. However, we may be able to find a substitute medication at lower cost or fill out paperwork to get insurance to cover a needed medication.   If a prior authorization is required to get your medication covered by your insurance company, please allow us 1-2 business days to complete this process.  Drug prices often vary depending on where the prescription is filled and some pharmacies may offer cheaper prices.  The website www.goodrx.com contains coupons for medications through different pharmacies. The prices here do not account for what the cost may be with help from insurance (it may be cheaper with your insurance), but the website can give you the price if you did not use any insurance.  - You can print the associated coupon and take it with your prescription to the pharmacy.  - You may also stop by our office during regular business hours and pick up a GoodRx coupon card.  - If you need your prescription sent electronically to a different pharmacy, notify our office  through [facility] MyChart or by phone at [phone] option 4.     Si Usted Necesita Algo Despus de Su Visita  Tambin puede enviarnos un mensaje a travs de MyChart. Por lo general respondemos a los mensajes de MyChart en el transcurso de 1 a 2 das hbiles.  Para renovar recetas, por favor pida a su farmacia que se ponga en contacto con nuestra oficina. Nuestro nmero de fax es el [phone].  Si tiene un asunto urgente cuando la clnica est cerrada y que no puede esperar hasta el siguiente da hbil, puede llamar/localizar a su doctor(a) al nmero que aparece a continuacin.   Por favor, tenga en cuenta que aunque hacemos todo lo posible para estar disponibles para asuntos urgentes fuera del horario de oficina, no estamos disponibles las 24 horas del da, los 7 das de la semana.   Si tiene un problema urgente y   no puede comunicarse con nosotros, puede optar por buscar atencin mdica  en el consultorio de su doctor(a), en una clnica privada, en un centro de atencin urgente o en una sala de emergencias.  Si tiene una emergencia mdica, por favor llame inmediatamente al 911 o vaya a la sala de emergencias.  Nmeros de bper  - Dr. Kowalski: [phone]  - Dra. Moye: [phone]  - Dra. Stewart: [phone]  En caso de inclemencias del tiempo, por favor llame a nuestra lnea principal al [phone] para una actualizacin sobre el estado de cualquier retraso o cierre.  Consejos para la medicacin en dermatologa: Por favor, guarde las cajas en las que vienen los medicamentos de uso tpico para ayudarle a seguir las instrucciones sobre dnde y cmo usarlos. Las farmacias generalmente imprimen las instrucciones del medicamento slo en las cajas y no directamente en los tubos del medicamento.   Si su medicamento es muy caro, por favor, pngase en contacto con nuestra oficina llamando al [phone] y presione la opcin 4 o envenos un mensaje a travs de MyChart.   No  podemos decirle cul ser su copago por los medicamentos por adelantado ya que esto es diferente dependiendo de la cobertura de su seguro. Sin embargo, es posible que podamos encontrar un medicamento sustituto a menor costo o llenar un formulario para que el seguro cubra el medicamento que se considera necesario.   Si se requiere una autorizacin previa para que su compaa de seguros cubra su medicamento, por favor permtanos de 1 a 2 das hbiles para completar este proceso.  Los precios de los medicamentos varan con frecuencia dependiendo del lugar de dnde se surte la receta y alguna farmacias pueden ofrecer precios ms baratos.  El sitio web www.goodrx.com tiene cupones para medicamentos de diferentes farmacias. Los precios aqu no tienen en cuenta lo que podra costar con la ayuda del seguro (puede ser ms barato con su seguro), pero el sitio web puede darle el precio si no utiliz ningn seguro.  - Puede imprimir el cupn correspondiente y llevarlo con su receta a la farmacia.  - Tambin puede pasar por nuestra oficina durante el horario de atencin regular y recoger una tarjeta de cupones de GoodRx.  - Si necesita que su receta se enve electrnicamente a una farmacia diferente, informe a nuestra oficina a travs de MyChart de [facility] o por telfono llamando al [phone] y presione la opcin 4.  

## 2022-12-19 ENCOUNTER — Encounter: Payer: Self-pay | Admitting: Family Medicine

## 2022-12-19 ENCOUNTER — Ambulatory Visit: Payer: Self-pay | Admitting: *Deleted

## 2022-12-19 ENCOUNTER — Ambulatory Visit: Payer: 59 | Admitting: Family Medicine

## 2022-12-19 VITALS — BP 136/69 | HR 93 | Temp 98.6°F | Resp 16 | Wt 198.5 lb

## 2022-12-19 DIAGNOSIS — U071 COVID-19: Secondary | ICD-10-CM | POA: Diagnosis not present

## 2022-12-19 NOTE — Telephone Encounter (Signed)
Summary: positive for COVID / mild fever 101.00,congestion, and cough.   Pt stated he is positive for COVID and tested positive yesterday and this morning. Current symptoms mild fever 101.00, congestion, and cough. Stated he is seeking clinical advice on what he should do.       Chief Complaint: covid positive at home test yesterday  Symptoms: cough, congestion, fever 101 O2 sat at 98% room air Frequency: sx started Monday 12/16/22 Pertinent Negatives: Patient denies chest pain no difficulty breathing  Disposition: []$ ED /[]$ Urgent Care (no appt availability in office) / [x]$ Appointment(In office/virtual)/ []$  [facility]/ []$ Home Care/ []$ Refused Recommended Disposition /[]$ [facility]/ []$  Follow-up with PCP Additional Notes:  Appt scheduled today        Reason for Disposition  [1] HIGH RISK patient (e.g., weak immune system, age > 51 years, obesity with BMI 30 or higher, pregnant, chronic lung disease or other chronic medical condition) AND [2] COVID symptoms (e.g., cough, fever)  (Exceptions: Already seen by PCP and no new or worsening symptoms.)  Answer Assessment - Initial Assessment Questions 1. COVID-19 DIAGNOSIS: "How do you know that you have COVID?" (e.g., positive lab test or self-test, diagnosed by doctor or NP/PA, symptoms after exposure).     At home covid test positive yesterday  2. COVID-19 EXPOSURE: "Was there any known exposure to COVID before the symptoms began?" CDC Definition of close contact: within 6 feet (2 meters) for a total of 15 minutes or more over a 24-hour period.      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 ONSET: "When did the COVID-19 symptoms start?"      Cough , fever 101, congestion 4. WORST SYMPTOM: "What is your worst symptom?" (e.g., cough, fever, shortness of breath, muscle aches)     See above 5. COUGH: "Do you have a cough?" If Yes, ask: "How bad is the cough?"       Yes 6. FEVER: "Do you have a fever?" If Yes, ask: "What  is your temperature, how was it measured, and when did it start?"     Yes  7. RESPIRATORY STATUS: "Describe your breathing?" (e.g., normal; shortness of breath, wheezing, unable to speak)      na 8. BETTER-SAME-WORSE: "Are you getting better, staying the same or getting worse compared to yesterday?"  If getting worse, ask, "In what way?"     On day 4 of sx 9. OTHER SYMPTOMS: "Do you have any other symptoms?"  (e.g., chills, fatigue, headache, loss of smell or taste, muscle pain, sore throat)     Cough, congestion fever 10. HIGH RISK DISEASE: "Do you have any chronic medical problems?" (e.g., asthma, heart or lung disease, weak immune system, obesity, etc.)       na 11. VACCINE: "Have you had the COVID-19 vaccine?" If Yes, ask: "Which one, how many shots, when did you get it?"       Moderna 12. PREGNANCY: "Is there any chance you are pregnant?" "When was your last menstrual period?"       na 13. O2 SATURATION MONITOR:  "Do you use an oxygen saturation monitor (pulse oximeter) at home?" If Yes, ask "What is your reading (oxygen level) today?" "What is your usual oxygen saturation reading?" (e.g., 95%)       98% room air  Protocols used: Coronavirus (COVID-19) Diagnosed or Suspected-A-AH

## 2022-12-19 NOTE — Progress Notes (Signed)
I,[name],acting as a scribe for [employer], MD.,have documented all relevant documentation on the behalf of Paul Foster, MD,as directed by  Paul Foster, MD while in the presence of Paul Foster, MD.   Established patient visit   Patient: Paul Espinoza   DOB: 07-01-1961   62 y.o. Male  MRN: [id number] Visit Date: 12/19/2022  Today's healthcare provider: [name], MD   Chief Complaint  Patient presents with   Covid Positive   Subjective    HPI  Covid Positive: Patient reports testing positive for Covid yesterday and this morning. Current symptoms mild fever 101.0, congestion, and cough, headache. Took IBU this morning. States that his symptoms are mild  He has questions about anti-viral therapy and if it would be warranted given the symptoms are improving  He reports having pulse oximeter at home  Denies trouble breathing  Reports that his wife has been around him prior to knowing his test was positive  He denies diarrhea or nausea  States that he still has senses of taste and smell  Reports symptoms have been around for about four days  He adds that he is hesitant to start anti-viral therapy because the covid vaccines caused him to be fatigued with both doses   Medications: Outpatient Medications Prior to Visit  Medication Sig   allopurinol (ZYLOPRIM) 100 MG tablet TAKE TWO TABLETS BY MOUTH DAILY   colchicine 0.6 MG tablet TAKE TWO TABLETS BY MOUTH ON FIRST DAY, THEN TAKE ONE TABLET BY MOUTH DAILY   fluticasone (FLONASE) 50 MCG/ACT nasal spray Place 2 sprays into both nostrils daily.   hydrochlorothiazide (HYDRODIURIL) 50 MG tablet Take 50 mg by mouth daily.   indomethacin (INDOCIN) 50 MG capsule TAKE ONE CAPSULE BY MOUTH THREE TIMES A DAY WITH FOOD AS NEEDED FOR GOUT FLARES   KLOR-CON M20 20 MEQ tablet Take 20 mEq by mouth daily.   Multiple Vitamin (MULTIVITAMIN) capsule Take 1 capsule by  mouth daily.   predniSONE (DELTASONE) 10 MG tablet Take 10 mg by mouth 2 (two) times a week.   No facility-administered medications prior to visit.    Review of Systems     Objective    BP 136/69 (BP Location: Right Arm, Patient Position: Sitting, Cuff Size: Normal)   Pulse 93   Temp 98.6 F (37 C) (Oral)   Resp 16   Wt 198 lb 8 oz (90 kg)   SpO2 96%   BMI 23.54 kg/m    Physical Exam Vitals reviewed.  Constitutional:      General: He is not in acute distress.    Appearance: Normal appearance. He is ill-appearing. He is not toxic-appearing or diaphoretic.     Comments: Does not appear to be toxic, mildly ill appearing   HENT:     Nose:     Right Nostril: No epistaxis.     Left Nostril: No epistaxis.     Right Turbinates: Swollen.     Left Turbinates: Swollen.     Mouth/Throat:     Mouth: Mucous membranes are moist.     Pharynx: No oropharyngeal exudate or posterior oropharyngeal erythema.  Eyes:     Conjunctiva/sclera: Conjunctivae normal.  Cardiovascular:     Rate and Rhythm: Normal rate and regular rhythm.     Pulses: Normal pulses.     Heart sounds: Normal heart sounds. No murmur heard.    No friction rub. No gallop.  Pulmonary:     Effort: Pulmonary effort is normal.  No respiratory distress.     Breath sounds: Normal breath sounds. No stridor. No wheezing, rhonchi or rales.     Comments: Rare cough throughout exam  Abdominal:     General: Bowel sounds are normal. There is no distension.     Palpations: Abdomen is soft.     Tenderness: There is no abdominal tenderness.  Musculoskeletal:     Right lower leg: No edema.     Left lower leg: No edema.  Skin:    Findings: No erythema or rash.  Neurological:     Mental Status: He is alert and oriented to person, place, and time.      No results found for any visits on 12/19/22.  Assessment & Plan     Problem List Items Addressed This Visit       Other   COVID-19 - Primary    Acute  Patient is stable   We discussed antivirals, potential side effects and need to start within 5 days  Patient counseled on washing hands, disinfecting commonly touched surfaces in the home and to wear mask around his wife if she tests negative for COVID  Ultimately patient would like to do conservative measures for symptoms and not start anti-viral treatment at this time  Reviewed ED precautions including SOB, lightheadedness or dizziness, patient voiced understanding         Return if symptoms worsen or fail to improve.        The entirety of the information documented in the History of Present Illness, Review of Systems and Physical Exam were personally obtained by me. Portions of this information were initially documented by Lyndel Pleasure, CMA and reviewed by me for thoroughness and accuracy.Paul Foster, MD     Paul Foster, MD  [facility] [phone] (phone) [phone] (fax)  [place]

## 2022-12-20 DIAGNOSIS — U071 COVID-19: Secondary | ICD-10-CM

## 2022-12-20 HISTORY — DX: COVID-19: U07.1

## 2022-12-20 NOTE — Assessment & Plan Note (Signed)
Acute  Patient is stable  We discussed antivirals, potential side effects and need to start within 5 days  Patient counseled on washing hands, disinfecting commonly touched surfaces in the home and to wear mask around his wife if she tests negative for COVID  Ultimately patient would like to do conservative measures for symptoms and not start anti-viral treatment at this time  Reviewed ED precautions including SOB, lightheadedness or dizziness, patient voiced understanding

## 2023-04-18 ENCOUNTER — Other Ambulatory Visit: Payer: Self-pay | Admitting: Family Medicine

## 2023-05-26 ENCOUNTER — Encounter: Payer: Self-pay | Admitting: Family Medicine

## 2023-05-26 ENCOUNTER — Ambulatory Visit (INDEPENDENT_AMBULATORY_CARE_PROVIDER_SITE_OTHER): Payer: 59 | Admitting: Family Medicine

## 2023-05-26 VITALS — BP 129/70 | HR 60 | Temp 98.1°F | Resp 16 | Ht 77.0 in | Wt 194.0 lb

## 2023-05-26 DIAGNOSIS — R739 Hyperglycemia, unspecified: Secondary | ICD-10-CM

## 2023-05-26 DIAGNOSIS — E790 Hyperuricemia without signs of inflammatory arthritis and tophaceous disease: Secondary | ICD-10-CM | POA: Diagnosis not present

## 2023-05-26 DIAGNOSIS — Z125 Encounter for screening for malignant neoplasm of prostate: Secondary | ICD-10-CM

## 2023-05-26 DIAGNOSIS — Z85828 Personal history of other malignant neoplasm of skin: Secondary | ICD-10-CM

## 2023-05-26 DIAGNOSIS — Z8739 Personal history of other diseases of the musculoskeletal system and connective tissue: Secondary | ICD-10-CM | POA: Diagnosis not present

## 2023-05-26 DIAGNOSIS — Z136 Encounter for screening for cardiovascular disorders: Secondary | ICD-10-CM

## 2023-05-26 DIAGNOSIS — E781 Pure hyperglyceridemia: Secondary | ICD-10-CM

## 2023-05-26 DIAGNOSIS — G629 Polyneuropathy, unspecified: Secondary | ICD-10-CM

## 2023-05-26 DIAGNOSIS — Z0000 Encounter for general adult medical examination without abnormal findings: Secondary | ICD-10-CM | POA: Diagnosis not present

## 2023-05-26 DIAGNOSIS — Z8249 Family history of ischemic heart disease and other diseases of the circulatory system: Secondary | ICD-10-CM

## 2023-05-26 DIAGNOSIS — Z974 Presence of external hearing-aid: Secondary | ICD-10-CM

## 2023-05-26 NOTE — Progress Notes (Unsigned)
Complete physical exam   Patient: Paul Espinoza   DOB: 05-02-1961   62 y.o. Male  MRN: [id number] Visit Date: 05/26/2023  Today's healthcare provider: Mila Merry, MD   Chief Complaint  Patient presents with   Annual Exam   Subjective    Paul Espinoza is a 62 y.o. male who presents today for a complete physical exam.  He reports consuming a  healthy  diet.  He exercises regularly  He generally feels well. He reports sleeping fairly well. He does not have additional problems to discuss today.   He reports having had no gout attacks for a few years, since being on current dose of allopurinol. He takes hydrochlorothiazide only for Meniere's disease. He has neuropathy but no longer takes any supplements of gabapentin since none have really made any difference. He is cautious about his sugar intake due to history of prediabetes.   He is noted to have family history of CAD and mildly elevated LDL last year.  Lab Results  Component Value Date   CHOL 195 05/15/2022   HDL 41 05/15/2022   LDLCALC 133 (H) 05/15/2022   TRIG 116 05/15/2022   CHOLHDL 4.8 05/15/2022     Past Medical History:  Diagnosis Date   Cataract Mar 25, 2022   COVID-19 12/20/2022   History of chicken pox    Neuromuscular disorder [facility]) Sep 18, 2021   Squamous cell carcinoma of skin 01/31/2020   left medial cheek   Vertigo    Past Surgical History:  Procedure Laterality Date   COLONOSCOPY WITH PROPOFOL N/A 10/09/2015   Procedure: COLONOSCOPY WITH PROPOFOL;  Surgeon: Wallace Cullens, MD;  Location: [facility] ENDOSCOPY;  Service: Gastroenterology;  Laterality: N/A;   Social History   Socioeconomic History   Marital status: Married    Spouse name: Not on file   Number of children: Not on file   Years of education: Not on file   Highest education level: Not on file  Occupational History    Comment: [employer]  Tobacco Use   Smoking status: Never   Smokeless tobacco: Never   Vaping Use   Vaping status: Never Used  Substance and Sexual Activity   Alcohol use: Yes    Alcohol/week: 5.0 standard drinks of alcohol    Types: 2 Glasses of wine, 1 Cans of beer, 2 Shots of liquor per week    Comment: occasional   Drug use: Never   Sexual activity: Yes    Birth control/protection: None  Other Topics Concern   Not on file  Social History Narrative   Not on file   Social Determinants of Health   Financial Resource Strain: Not on file  Food Insecurity: Not on file  Transportation Needs: Not on file  Physical Activity: Not on file  Stress: Not on file  Social Connections: Not on file  Intimate Partner Violence: Not on file   Family Status  Relation Name Status   Mother Paul Espinoza Deceased   Father Paul Espinoza Deceased at age 48   Brother Bill Alive   Brother  Alive   Brother  Alive   PGM Grandma Paul Espinoza (Not Specified)  No partnership data on file   Family History  Problem Relation Age of Onset   Heart disease Mother    Breast cancer Mother    Colon polyps Mother    Cancer Mother    Colon cancer Father    Colon polyps Father    Heart disease Father  Cancer Father    Colon cancer Brother    Cancer Brother    Diabetes Paternal Grandmother    No Known Allergies  Patient Care Team: Malva Limes, MD as PCP - General (Family Medicine) Deirdre Evener, MD (Dermatology) Bud Face, MD as Referring Physician (Otolaryngology) Stanton Kidney, MD as Consulting Physician (Gastroenterology) Hilaria Ota, MD as Referring Physician (Otolaryngology)   Medications: Outpatient Medications Prior to Visit  Medication Sig   allopurinol (ZYLOPRIM) 100 MG tablet TAKE 2 TABLETS BY MOUTH DAILY   betamethasone valerate ointment (VALISONE) 0.1 % Apply topically.   colchicine 0.6 MG tablet TAKE TWO TABLETS BY MOUTH ON FIRST DAY, THEN TAKE ONE TABLET BY MOUTH DAILY   fluticasone (FLONASE) 50 MCG/ACT nasal spray Place 2 sprays into both nostrils daily.    hydrochlorothiazide (HYDRODIURIL) 50 MG tablet Take 50 mg by mouth daily.   indomethacin (INDOCIN) 50 MG capsule TAKE ONE CAPSULE BY MOUTH THREE TIMES A DAY WITH FOOD AS NEEDED FOR GOUT FLARES   KLOR-CON M20 20 MEQ tablet Take 20 mEq by mouth daily.   Multiple Vitamin (MULTIVITAMIN) capsule Take 1 capsule by mouth daily.   predniSONE (DELTASONE) 10 MG tablet Take 10 mg by mouth 2 (two) times a week.   No facility-administered medications prior to visit.    Review of Systems  Constitutional:  Negative for appetite change, chills, diaphoresis and fever.  HENT:  Negative for congestion, ear discharge, ear pain, nosebleeds, sore throat and tinnitus.   Eyes:  Negative for photophobia, pain, discharge and redness.  Respiratory:  Negative for cough, chest tightness, shortness of breath, wheezing and stridor.   Cardiovascular:  Negative for chest pain, palpitations and leg swelling.  Gastrointestinal:  Negative for abdominal pain, blood in stool, constipation, diarrhea, nausea and vomiting.  Endocrine: Negative for polydipsia.  Genitourinary:  Negative for dysuria, flank pain, frequency, hematuria and urgency.  Musculoskeletal:  Negative for back pain, myalgias and neck pain.  Skin:  Negative for rash.  Allergic/Immunologic: Negative for environmental allergies.  Neurological:  Negative for dizziness, tremors, seizures, weakness and headaches.  Hematological:  Does not bruise/bleed easily.  Psychiatric/Behavioral:  Negative for hallucinations and suicidal ideas. The patient is not nervous/anxious.      Objective    BP 129/70 (BP Location: Left Arm, Patient Position: Sitting, Cuff Size: Normal)   Pulse 60   Temp 98.1 F (36.7 C) (Temporal)   Resp 16   Ht 6\' 5"  (1.956 m)   Wt 194 lb (88 kg)   BMI 23.01 kg/m   Physical Exam   General Appearance:    Well developed, well nourished male. Alert, cooperative, in no acute distress, appears stated age  Head:    Normocephalic, without obvious  abnormality, atraumatic  Eyes:    PERRL, conjunctiva/corneas clear, EOM's intact, fundi    benign, both eyes       Ears:    Normal TM's and external ear canals, both ears. Wears hearing aids.   Nose:   Nares normal, septum midline, mucosa normal, no drainage   or sinus tenderness  Throat:   Lips, mucosa, and tongue normal; teeth and gums normal  Neck:   Supple, symmetrical, trachea midline, no adenopathy;       thyroid:  No enlargement/tenderness/nodules; no carotid   bruit or JVD  Back:     Symmetric, no curvature, ROM normal, no CVA tenderness  Lungs:     Clear to auscultation bilaterally, respirations unlabored  Chest wall:  No tenderness or deformity  Heart:    Normal heart rate. Normal rhythm. No murmurs, rubs, or gallops.  S1 and S2 normal  Abdomen:     Soft, non-tender, bowel sounds active all four quadrants,    no masses, no organomegaly  Genitalia:    deferred  Rectal:    deferred  Extremities:   All extremities are intact. No cyanosis or edema  Pulses:   2+ and symmetric all extremities  Skin:   Skin color, texture, turgor normal, no rashes or lesions  Lymph nodes:   Cervical, supraclavicular, and axillary nodes normal  Neurologic:   CNII-XII intact. Normal strength, sensation and reflexes      throughout     Last depression screening scores    05/26/2023    8:34 AM 05/15/2022    9:03 AM 05/14/2021   10:09 AM  PHQ 2/9 Scores  PHQ - 2 Score 0 0 0  PHQ- 9 Score  0 1   Last fall risk screening    05/26/2023    8:34 AM  Fall Risk   Falls in the past year? 0  Number falls in past yr: 0  Injury with Fall? 0  Risk for fall due to : No Fall Risks  Follow up Falls evaluation completed   Last Audit-C alcohol use screening    05/14/2021   10:10 AM  Alcohol Use Disorder Test (AUDIT)  1. How often do you have a drink containing alcohol? 4  2. How many drinks containing alcohol do you have on a typical day when you are drinking? 0  3. How often do you have six or more  drinks on one occasion? 0  AUDIT-C Score 4   A score of 3 or more in women, and 4 or more in men indicates increased risk for alcohol abuse, EXCEPT if all of the points are from question 1   No results found for any visits on 05/26/23.  Assessment & Plan    Routine Health Maintenance and Physical Exam  Exercise Activities and Dietary recommendations  Goals   None     Immunization History  Administered Date(s) Administered   Tdap 04/24/2010, 04/26/2020    Health Maintenance  Topic Date Due   COVID-19 Vaccine (1) Never done   Zoster Vaccines- Shingrix (1 of 2) Never done   HIV Screening  01/10/2027 (Originally 05/25/1976)   INFLUENZA VACCINE  06/05/2023   Colonoscopy  12/26/2025   DTaP/Tdap/Td (3 - Td or Tdap) 04/26/2030   Hepatitis C Screening  Completed   HPV VACCINES  Aged Out    Discussed health benefits of physical activity, and encouraged him to engage in regular exercise appropriate for his age and condition.  He declined recommended Shingrix vaccine.   2. Hyperglycemia  - Hemoglobin A1c  3. Personal history of gout No gout flares since being on current dose of allopurinol.   4. Hyperuricemia  - Uric acid  5. Polyneuropathy Stable. Off gabapentin and supplements which didn't seem to be helping.   6. History of SCC (squamous cell carcinoma) of skin Continue routine follow up derm  7. Wears hearing aid   8. Family history of heart disease   9. Encounter for special screening examination for cardiovascular disorder  - Lipid panel - Lipoprotein A (LPA) - Apolipoprotein B  10. Hypertriglyceridemia  - CBC - Comprehensive metabolic panel - Lipid panel - Lipoprotein A (LPA) - Apolipoprotein B  11. Prostate cancer screening  - PSA Total (Reflex To Free)  Mila Merry, MD  [facility] [phone] (phone) [phone] (fax)  [facility]

## 2023-05-27 LAB — APOLIPOPROTEIN B: Apolipoprotein B: 104 mg/dL — ABNORMAL HIGH (ref <90)

## 2023-05-27 LAB — PSA TOTAL (REFLEX TO FREE): Prostate Specific Ag, Serum: 0.6 ng/mL (ref 0.0–4.0)

## 2023-05-28 ENCOUNTER — Telehealth: Payer: Self-pay

## 2023-05-28 DIAGNOSIS — E781 Pure hyperglyceridemia: Secondary | ICD-10-CM

## 2023-05-28 DIAGNOSIS — R739 Hyperglycemia, unspecified: Secondary | ICD-10-CM

## 2023-05-28 LAB — LIPID PANEL
Chol/HDL Ratio: 4.7 ratio (ref 0.0–5.0)
Cholesterol, Total: 196 mg/dL (ref 100–199)
HDL: 42 mg/dL
LDL Chol Calc (NIH): 134 mg/dL — ABNORMAL HIGH (ref 0–99)
Triglycerides: 108 mg/dL (ref 0–149)
VLDL Cholesterol Cal: 20 mg/dL (ref 5–40)

## 2023-05-28 LAB — COMPREHENSIVE METABOLIC PANEL WITH GFR
ALT: 16 IU/L (ref 0–44)
AST: 13 IU/L (ref 0–40)
Albumin: 4.5 g/dL (ref 3.9–4.9)
Alkaline Phosphatase: 60 IU/L (ref 44–121)
BUN/Creatinine Ratio: 18 (ref 10–24)
BUN: 19 mg/dL (ref 8–27)
Bilirubin Total: 0.6 mg/dL (ref 0.0–1.2)
CO2: 28 mmol/L (ref 20–29)
Calcium: 9.3 mg/dL (ref 8.6–10.2)
Chloride: 98 mmol/L (ref 96–106)
Creatinine, Ser: 1.03 mg/dL (ref 0.76–1.27)
Globulin, Total: 2 g/dL (ref 1.5–4.5)
Glucose: 151 mg/dL — ABNORMAL HIGH (ref 70–99)
Potassium: 3.8 mmol/L (ref 3.5–5.2)
Sodium: 140 mmol/L (ref 134–144)
Total Protein: 6.5 g/dL (ref 6.0–8.5)
eGFR: 82 mL/min/1.73

## 2023-05-28 LAB — CBC
Hematocrit: 42.9 % (ref 37.5–51.0)
Hemoglobin: 14.2 g/dL (ref 13.0–17.7)
MCH: 31.4 pg (ref 26.6–33.0)
MCHC: 33.1 g/dL (ref 31.5–35.7)
MCV: 95 fL (ref 79–97)
Platelets: 175 x10E3/uL (ref 150–450)
RBC: 4.52 x10E6/uL (ref 4.14–5.80)
RDW: 13.1 % (ref 11.6–15.4)
WBC: 7.2 x10E3/uL (ref 3.4–10.8)

## 2023-05-28 LAB — LIPOPROTEIN A (LPA): Lipoprotein (a): 23.4 nmol/L (ref <75.0)

## 2023-05-28 LAB — URIC ACID: Uric Acid: 6.4 mg/dL (ref 3.8–8.4)

## 2023-05-28 LAB — HEMOGLOBIN A1C
Est. average glucose Bld gHb Est-mCnc: 143 mg/dL
Hgb A1c MFr Bld: 6.6 % — ABNORMAL HIGH (ref 4.8–5.6)

## 2023-05-28 LAB — PSA TOTAL (REFLEX TO FREE)

## 2023-05-28 MED ORDER — ROSUVASTATIN CALCIUM 5 MG PO TABS
5.0000 mg | ORAL_TABLET | Freq: Every day | ORAL | 1 refills | Status: DC
Start: 2023-05-28 — End: 2023-11-28

## 2023-05-28 NOTE — Telephone Encounter (Signed)
-----   Message from Mila Merry sent at 05/28/2023  7:45 AM EDT ----- Average sugar is now 143 which is in diabetes range. Need referral to medical nutrition at [facility] lifestyle center.  Cholesterol and Apolipoprotein B are too high, which is high risk for developing heart disease.  Need to start taking rosuvastatin 5mg , 1 tablet daily, please send prescription for #90 rf x 1. Please schedule follow up office visit 3 months.

## 2023-05-28 NOTE — Addendum Note (Signed)
Addended by: Malva Limes on: 05/28/2023 09:17 AM   Modules accepted: Orders

## 2023-06-25 ENCOUNTER — Encounter: Payer: Self-pay | Admitting: Dermatology

## 2023-06-25 ENCOUNTER — Ambulatory Visit: Payer: 59 | Admitting: Dermatology

## 2023-06-25 DIAGNOSIS — L570 Actinic keratosis: Secondary | ICD-10-CM

## 2023-06-25 DIAGNOSIS — L820 Inflamed seborrheic keratosis: Secondary | ICD-10-CM | POA: Diagnosis not present

## 2023-06-25 DIAGNOSIS — L814 Other melanin hyperpigmentation: Secondary | ICD-10-CM

## 2023-06-25 DIAGNOSIS — L578 Other skin changes due to chronic exposure to nonionizing radiation: Secondary | ICD-10-CM | POA: Diagnosis not present

## 2023-06-25 DIAGNOSIS — Z79899 Other long term (current) drug therapy: Secondary | ICD-10-CM

## 2023-06-25 DIAGNOSIS — Z8589 Personal history of malignant neoplasm of other organs and systems: Secondary | ICD-10-CM

## 2023-06-25 DIAGNOSIS — L219 Seborrheic dermatitis, unspecified: Secondary | ICD-10-CM | POA: Diagnosis not present

## 2023-06-25 DIAGNOSIS — Z1283 Encounter for screening for malignant neoplasm of skin: Secondary | ICD-10-CM | POA: Diagnosis not present

## 2023-06-25 DIAGNOSIS — Z872 Personal history of diseases of the skin and subcutaneous tissue: Secondary | ICD-10-CM

## 2023-06-25 DIAGNOSIS — W908XXA Exposure to other nonionizing radiation, initial encounter: Secondary | ICD-10-CM

## 2023-06-25 DIAGNOSIS — D1801 Hemangioma of skin and subcutaneous tissue: Secondary | ICD-10-CM

## 2023-06-25 DIAGNOSIS — D229 Melanocytic nevi, unspecified: Secondary | ICD-10-CM

## 2023-06-25 DIAGNOSIS — L821 Other seborrheic keratosis: Secondary | ICD-10-CM

## 2023-06-25 DIAGNOSIS — Z7189 Other specified counseling: Secondary | ICD-10-CM

## 2023-06-25 DIAGNOSIS — Z85828 Personal history of other malignant neoplasm of skin: Secondary | ICD-10-CM

## 2023-06-25 MED ORDER — KETOCONAZOLE 2 % EX SHAM
1.0000 | MEDICATED_SHAMPOO | Freq: Once | CUTANEOUS | 11 refills | Status: AC
Start: 2023-06-25 — End: 2023-06-25

## 2023-06-25 NOTE — Patient Instructions (Addendum)

## 2023-06-25 NOTE — Progress Notes (Signed)
Follow-Up Visit   Subjective  Paul Espinoza is a 62 y.o. male who presents for the following: Skin Cancer Screening and Full Body Skin Exam  The patient presents for Total-Body Skin Exam (TBSE) for skin cancer screening and mole check. The patient has spots, moles and lesions to be evaluated, some may be new or changing and the patient may have concern these could be cancer.  Patient with hx of SCC and AK's.  The following portions of the chart were reviewed this encounter and updated as appropriate: medications, allergies, medical history  Review of Systems:  No other skin or systemic complaints except as noted in HPI or Assessment and Plan.  Objective  Well appearing patient in no apparent distress; mood and affect are within normal limits.  A full examination was performed including scalp, head, eyes, ears, nose, lips, neck, chest, axillae, abdomen, back, buttocks, bilateral upper extremities, bilateral lower extremities, hands, feet, fingers, toes, fingernails, and toenails. All findings within normal limits unless otherwise noted below.   Relevant physical exam findings are noted in the Assessment and Plan.  left clavicle x 2 (2) Erythematous stuck-on, waxy papule or plaque  Left Ear Erythematous thin papules/macules with gritty scale.     Assessment & Plan   SKIN CANCER SCREENING PERFORMED TODAY.  ACTINIC DAMAGE - Chronic condition, secondary to cumulative UV/sun exposure - diffuse scaly erythematous macules with underlying dyspigmentation - Recommend daily broad spectrum sunscreen SPF 30+ to sun-exposed areas, reapply every 2 hours as needed.  - Staying in the shade or wearing long sleeves, sun glasses (UVA+UVB protection) and wide brim hats (4-inch brim around the entire circumference of the hat) are also recommended for sun protection.  - Call for new or changing lesions.  LENTIGINES, SEBORRHEIC KERATOSES, HEMANGIOMAS - Benign normal skin lesions -  Benign-appearing - Call for any changes  MELANOCYTIC NEVI - Tan-brown and/or pink-flesh-colored symmetric macules and papules - Benign appearing on exam today - Observation - Call clinic for new or changing moles - Recommend daily use of broad spectrum spf 30+ sunscreen to sun-exposed areas.   HISTORY OF SQUAMOUS CELL CARCINOMA OF THE SKIN - Left medial cheek, 01/31/2020 - No evidence of recurrence today - No lymphadenopathy - Recommend regular full body skin exams - Recommend daily broad spectrum sunscreen SPF 30+ to sun-exposed areas, reapply every 2 hours as needed.  - Call if any new or changing lesions are noted between office visits  SEBORRHEIC DERMATITIS Exam: Pink patches with greasy scale at scalp  Chronic and persistent condition with duration or expected duration over one year. Condition is symptomatic/ bothersome to patient. Not currently at goal.   Seborrheic Dermatitis is a chronic persistent rash characterized by pinkness and scaling most commonly of the mid face but also can occur on the scalp (dandruff), ears; mid chest, mid back and groin.  It tends to be exacerbated by stress and cooler weather.  People who have neurologic disease may experience new onset or exacerbation of existing seborrheic dermatitis.  The condition is not curable but treatable and can be controlled.  Treatment Plan: Start ketoconazole 2% apply three times per week, massage into scalp and leave in for 5-10 minutes before rinsing out.     Seborrheic dermatitis  Related Medications ketoconazole (NIZORAL) 2 % shampoo Apply 1 Application topically once for 1 dose. apply three times per week, massage into scalp and leave in for 5-10 minutes before rinsing out  Inflamed seborrheic keratosis (2) left clavicle x 2  Symptomatic, irritating, patient would like treated.  Benign-appearing.  Call clinic for new or changing lesions.    Destruction of lesion - left clavicle x 2 (2)  Destruction  method: cryotherapy   Informed consent: discussed and consent obtained   Lesion destroyed using liquid nitrogen: Yes   Cryotherapy cycles:  2 Outcome: patient tolerated procedure well with no complications   Post-procedure details: wound care instructions given    AK (actinic keratosis) Left Ear  Actinic keratoses are precancerous spots that appear secondary to cumulative UV radiation exposure/sun exposure over time. They are chronic with expected duration over 1 year. A portion of actinic keratoses will progress to squamous cell carcinoma of the skin. It is not possible to reliably predict which spots will progress to skin cancer and so treatment is recommended to prevent development of skin cancer.  Recommend daily broad spectrum sunscreen SPF 30+ to sun-exposed areas, reapply every 2 hours as needed.  Recommend staying in the shade or wearing long sleeves, sun glasses (UVA+UVB protection) and wide brim hats (4-inch brim around the entire circumference of the hat). Call for new or changing lesions.   Destruction of lesion - Left Ear  Destruction method: cryotherapy   Informed consent: discussed and consent obtained   Lesion destroyed using liquid nitrogen: Yes   Cryotherapy cycles:  2 Outcome: patient tolerated procedure well with no complications   Post-procedure details: wound care instructions given     Return in about 1 year (around 06/24/2024) for TBSE, Hx AK, Hx SCC.  Anise Salvo, RMA, am acting as scribe for Armida Sans, MD .   Documentation: I have reviewed the above documentation for accuracy and completeness, and I agree with the above.  Armida Sans, MD

## 2023-06-26 ENCOUNTER — Encounter: Payer: 59 | Attending: Family Medicine | Admitting: Dietician

## 2023-06-26 ENCOUNTER — Encounter: Payer: Self-pay | Admitting: Dietician

## 2023-06-26 DIAGNOSIS — E781 Pure hyperglyceridemia: Secondary | ICD-10-CM | POA: Diagnosis present

## 2023-06-26 DIAGNOSIS — Z713 Dietary counseling and surveillance: Secondary | ICD-10-CM | POA: Insufficient documentation

## 2023-06-26 DIAGNOSIS — R739 Hyperglycemia, unspecified: Secondary | ICD-10-CM | POA: Insufficient documentation

## 2023-06-26 NOTE — Progress Notes (Signed)
Medical Nutrition Therapy  Appointment Start time:  0900  Appointment End time:  1020  Primary concerns today: Glycemic control, Cholesterol  Referral diagnosis: R73.9 - Hyperglycemia, E78.1 - Hypertriglyceridemia Preferred learning style: No preference indicated Learning readiness: Contemplating   NUTRITION ASSESSMENT   Anthropometrics  None taken   Clinical Medical Hx: HLD, Hyperglycemia, Gout, Skin cancer, Meniere disease Medications: Allopurinol, Colchicine, Hydrochlorothiazide, KCl, Prednisone, MVI Labs: A1c - 6.6%, LDL - 134 (high), Apolipoprotein B - 104 (high) Notable Signs/Symptoms: N/A   Lifestyle & Dietary Hx Pt reports history of prediabetes (5.8%, 05/15/2022), surprised at the elevation in A1c the past year.Pt reports cholesterol has been elevated for years, family hx of CVD/MI. Pt reports being prescribed new medications for diabetes (metformin) and cholesterol, but does not want to start taking them. 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 Pt states they thrive under pressure with work. Pt reports following a low carb/low sodium diet for past few years, reports history of using Atkins diet and losing weight. Pt reports really enjoying cheeses. Pt states they have a glass of wine/shot of bourbon most days, no more than 6 per week.   Estimated daily fluid intake: 48 oz Supplements: Chromium, Cinnamon, Maca Sleep: Urinary frequency r/t diuretics interrupts sleeps Stress / self-care: High stress at work Current average weekly physical activity: Walks/runs daily (~30 minutes)   24-Hr Dietary Recall First Meal: Srirach eggs (2), made w/ olive oil, 1 Tbsp cream cheese Snack: none Second Meal: Celery/carrot sticks, horseradish sauce, mixed nuts,  apple, sardine patte Snack: none Third Meal: Lasagna Snack: none Beverages: Water, sparkling water   NUTRITION DIAGNOSIS  NB-1.1 Food and nutrition-related knowledge deficit As related to Hyperglycemia, Hyperlipidemia.  As evidenced by A1c of 6.6%, LDL of 134, dietary recall high in saturated fats, high baseline stress level.   NUTRITION INTERVENTION  Nutrition education (E-1) on the following topics:  Educate pt on the difference between LDL and HDL cholesterol. Educate pt on the factors that can increase and decrease HDL cholesterol, including exercise to increase HDL, and tobacco use to decrease HDL. Educate pt on factors that can elevate LDL cholesterol, including high dietary intake of saturated fats. Educate pt on identifying sources of saturated fats, and how to make alternative food choices to lower saturated fat intake. Educate pt on the role of soluble fiber in binding to cholesterol in the GI tract an eliminating it from the body. Educate pt on dietary sources of soluble fiber. Educate pt on the potential dietary causes of hypertriglyceridemia, including concentrated sugars and sugar sweetened beverages. Educate on the role of elevated LDL,total cholesterol, and triglycerides on cardiovascular health. Educate pt on the role of physical activity in lowering LDL and increasing HDL cholesterol. Educated patient on the pathophysiology of diabetes. This includes why our bodies need circulating blood sugar, the relationship between insulin and blood sugar, and the results of insulin resistance and/or pancreatic insufficiency on the development of diabetes. Educated patient on factors that contribute to elevation of blood sugars, such as stress, illness, injury,and food choices. Discussed the role that physical activity plays in lowering blood sugar. Educate patient on the three main macronutrients. Protein, fats, and carbohydrates. Discussed how each of these macronutrients affect blood sugar  levels, especially carbohydrate, and the importance of eating a  consistent amount of carbohydrate throughout the day. on-starchy vegetables, 1/4 plate lean protein, 1/4 plate starches   Handouts Provided Include  Balanced Plate Food list Stress Response Diagram  Learning Style & Readiness for Change Teaching method utilized: Visual & Auditory  Demonstrated degree of understanding via: Teach Back  Barriers to learning/adherence to lifestyle change: None  Goals Established by Pt Work on lowering your daily stress level!! Please remember that this raises your blood sugar consistently each day! Begin to go home for your lunch break each day! Consider moderating alcohol consumption for a period of time and see if it impacts your fasting blood sugar values Check your blood sugar each morning before eating or drinking (fasting). Look for numbers under 130 mg/dL Your short term goal W0J is below 6.5% Add a serving of carbs to your breakfast. Try having your breakfast ~30 minutes before your walks. Use a combination of these three strategies to effectively lower your consumption of saturated fats. 1) Consume a smaller portion when having high fat dairy 2) Consume high fat dairy less frequently 3) Find a reduced or low fat version of that food   MONITORING & EVALUATION Dietary intake, weekly physical activity, and lab values in 3 months.  Next Steps  Patient is to follow up after next lab draw.

## 2023-06-26 NOTE — Patient Instructions (Addendum)
Work on lowering your daily stress level!! Please remember that this raises your blood sugar consistently each day! Begin to go home for your lunch break each day!  Consider moderating alcohol consumption for a period of time and see if it impacts your fasting blood sugar values  Check your blood sugar each morning before eating or drinking (fasting). Look for numbers under 130 mg/dL  Your short term goal M0N is below 6.5%  Add a serving of carbs to your breakfast. Try having your breakfast ~30 minutes before your walks.  Use a combination of these three strategies to effectively lower your consumption of saturated fats. 1) Consume a smaller portion when having high fat dairy 2) Consume high fat dairy less frequently 3) Find a reduced or low fat version of that food

## 2023-07-09 ENCOUNTER — Ambulatory Visit: Payer: 59 | Admitting: Dermatology

## 2023-08-29 ENCOUNTER — Ambulatory Visit: Payer: 59 | Admitting: Family Medicine

## 2023-08-29 VITALS — BP 108/66 | HR 61 | Temp 97.7°F | Resp 20 | Ht 77.0 in | Wt 187.7 lb

## 2023-08-29 DIAGNOSIS — E781 Pure hyperglyceridemia: Secondary | ICD-10-CM

## 2023-08-29 DIAGNOSIS — R7303 Prediabetes: Secondary | ICD-10-CM

## 2023-08-29 NOTE — Progress Notes (Signed)
Established patient visit   Patient: Paul Espinoza   DOB: 1961-02-13   62 y.o. Male  MRN: [id number] Visit Date: 08/29/2023  Today's healthcare provider: Mila Merry, MD   Chief Complaint  Patient presents with   Follow-up    3 mo f/u patient    Hyperlipidemia    3 mo f/u  not taken the medications doing things naturally   Subjective    Discussed the use of AI scribe software for clinical note transcription with the patient, who gave verbal consent to proceed.  History of Present Illness   The patient, with a history of high cholesterol and elevated glucose levels, has been actively working on lifestyle modifications to manage these conditions. He was prescribed rosuvastatin in July and referred to nutritionist. He elected not to start statin and has been working closely with the nutritions. He has been focusing on dietary changes, including reducing portion sizes, avoiding fatty meats, and limiting intake of rice and bread. These changes have resulted in a weight loss of approximately six to seven pounds.  The patient has also been monitoring his glucose levels daily, with weekly averages around 125-127. The highest readings have decreased from 180 to around 140, and the lowest readings are around 109-111. The patient has also significantly reduced his alcohol consumption, which he believes has contributed to the improvement in his glucose levels.  The patient has not reported any new health problems since his last visit. He has been trying to avoid getting sick, as he is aware of a flu-like illness circulating in his community. He has not reported any symptoms of this illness.       Medications: Outpatient Medications Prior to Visit  Medication Sig   allopurinol (ZYLOPRIM) 100 MG tablet TAKE 2 TABLETS BY MOUTH DAILY   betamethasone valerate ointment (VALISONE) 0.1 % Apply topically.   colchicine 0.6 MG tablet TAKE TWO TABLETS BY MOUTH ON FIRST DAY, THEN TAKE ONE  TABLET BY MOUTH DAILY   fluticasone (FLONASE) 50 MCG/ACT nasal spray Place 2 sprays into both nostrils daily.   hydrochlorothiazide (HYDRODIURIL) 50 MG tablet Take 50 mg by mouth daily.   indomethacin (INDOCIN) 50 MG capsule TAKE ONE CAPSULE BY MOUTH THREE TIMES A DAY WITH FOOD AS NEEDED FOR GOUT FLARES   KLOR-CON M20 20 MEQ tablet Take 20 mEq by mouth daily.   Multiple Vitamin (MULTIVITAMIN) capsule Take 1 capsule by mouth daily.   predniSONE (DELTASONE) 10 MG tablet Take 10 mg by mouth 2 (two) times a week.   rosuvastatin (CRESTOR) 5 MG tablet Take 1 tablet (5 mg total) by mouth daily. (Patient not taking: Reported on 06/26/2023)   No facility-administered medications prior to visit.   Review of Systems     Objective    BP 108/66 (BP Location: Left Arm, Patient Position: Sitting, Cuff Size: Normal)   Pulse 61   Temp 97.7 F (36.5 C)   Resp 20   Ht 6\' 5"  (1.956 m)   Wt 187 lb 11.2 oz (85.1 kg)   SpO2 100%   BMI 22.26 kg/m   Physical Exam  General appearance: Well developed, well nourished male, cooperative and in no acute distress Head: Normocephalic, without obvious abnormality, atraumatic Respiratory: Respirations even and unlabored, normal respiratory rate Extremities: All extremities are intact.  Skin: Skin color, texture, turgor normal. No rashes seen  Psych: Appropriate mood and affect. Neurologic: Mental status: Alert, oriented to person, place, and time, thought content appropriate.   Assessment &  Plan        Hyperlipidemia He elected not to start rosuvastatin prescribed in July. Patient has been making dietary changes to avoid the need for statin therapy. -Check lipid panel today to assess the effectiveness of dietary changes.  Hyperglycemia A1c in July 6.6. Patient has been making lifestyle modifications to control glucose levels, including dietary changes and stress management. Recent self-monitoring of blood glucose shows improvement. -Check HbA1c today to  assess the effectiveness of lifestyle modifications. -Continue current lifestyle modifications.    No follow-ups on file.      Mila Merry, MD  [facility] [phone] (phone) [phone] (fax)  [facility]

## 2023-08-30 LAB — LIPID PANEL
Chol/HDL Ratio: 4.7 ratio (ref 0.0–5.0)
Cholesterol, Total: 194 mg/dL (ref 100–199)
HDL: 41 mg/dL (ref >39)
LDL Chol Calc (NIH): 135 mg/dL — ABNORMAL HIGH (ref 0–99)
Triglycerides: 96 mg/dL (ref 0–149)
VLDL Cholesterol Cal: 18 mg/dL (ref 5–40)

## 2023-08-30 LAB — HEMOGLOBIN A1C
Est. average glucose Bld gHb Est-mCnc: 134 mg/dL
Hgb A1c MFr Bld: 6.3 % — ABNORMAL HIGH (ref 4.8–5.6)

## 2023-09-08 ENCOUNTER — Ambulatory Visit: Payer: 59 | Admitting: Dietician

## 2023-10-23 ENCOUNTER — Other Ambulatory Visit: Payer: Self-pay | Admitting: Family Medicine

## 2023-10-23 NOTE — Telephone Encounter (Signed)
Requested medications are due for refill today.  yes  Requested medications are on the active medications list.  yes  Last refill. 04/18/2023 #180 1 rf  Future visit scheduled.   yes  Notes to clinic.  Missing labs.    Requested Prescriptions  Pending Prescriptions Disp Refills   allopurinol (ZYLOPRIM) 100 MG tablet [Pharmacy Med Name: ALLOPURINOL 100 MG TABLET] 180 tablet 1    Sig: TAKE 2 TABLETS BY MOUTH DAILY     Endocrinology:  Gout Agents - allopurinol Failed - 10/23/2023 11:23 AM      Failed - CBC within normal limits and completed in the last 12 months    WBC  Date Value Ref Range Status  05/26/2023 7.2 3.4 - 10.8 x10E3/uL Final   RBC  Date Value Ref Range Status  05/26/2023 4.52 4.14 - 5.80 x10E6/uL Final   Hemoglobin  Date Value Ref Range Status  05/26/2023 14.2 13.0 - 17.7 g/dL Final   Hematocrit  Date Value Ref Range Status  05/26/2023 42.9 37.5 - 51.0 % Final   MCHC  Date Value Ref Range Status  05/26/2023 33.1 31.5 - 35.7 g/dL Final   [facility]  Date Value Ref Range Status  05/26/2023 31.4 26.6 - 33.0 pg Final   MCV  Date Value Ref Range Status  05/26/2023 95 79 - 97 fL Final   No results found for: "PLTCOUNTKUC", "LABPLAT", "POCPLA" RDW  Date Value Ref Range Status  05/26/2023 13.1 11.6 - 15.4 % Final         Passed - Uric Acid in normal range and within 360 days    Uric Acid  Date Value Ref Range Status  05/26/2023 6.4 3.8 - 8.4 mg/dL Final    Comment:               Therapeutic target for gout patients: <6.0         Passed - Cr in normal range and within 360 days    Creatinine, Ser  Date Value Ref Range Status  05/26/2023 1.03 0.76 - 1.27 mg/dL Final         Passed - Valid encounter within last 12 months    Recent Outpatient Visits           1 month ago Hypertriglyceridemia   [facility] Malva Limes, MD   5 months ago Annual physical exam   [facility] Malva Limes, MD    10 months ago COVID-19   [facility] Simmons-Robinson, [place], [place]   1 year ago Annual physical exam   [facility] Malva Limes, MD   2 years ago Annual physical exam   [facility] Malva Limes, MD       Future Appointments             In 7 months Fisher, Demetrios Isaacs, MD [facility]   In 9 months Deirdre Evener, MD [facility]

## 2023-11-28 ENCOUNTER — Ambulatory Visit: Payer: 59 | Admitting: Physician Assistant

## 2023-11-28 VITALS — BP 136/76 | HR 74 | Resp 16 | Wt 188.5 lb

## 2023-11-28 DIAGNOSIS — R059 Cough, unspecified: Secondary | ICD-10-CM | POA: Diagnosis not present

## 2023-11-28 DIAGNOSIS — J069 Acute upper respiratory infection, unspecified: Secondary | ICD-10-CM | POA: Diagnosis not present

## 2023-11-28 MED ORDER — AZITHROMYCIN 250 MG PO TABS: ORAL_TABLET | ORAL | 0 refills | Status: AC

## 2023-11-28 MED ORDER — HYDROCODONE BIT-HOMATROP MBR 5-1.5 MG/5ML PO SOLN: 5.0000 mL | Freq: Four times a day (QID) | ORAL | 0 refills | Status: DC | PRN

## 2023-11-28 NOTE — Progress Notes (Addendum)
Established patient visit  Patient: Paul Espinoza   DOB: 1961/01/25   63 y.o. Male  MRN: [id number] Visit Date: 11/28/2023  Today's healthcare provider: Debera Lat, PA-C   Chief Complaint  Patient presents with   URI    Patient has been taking Delsym and Mucinex   Subjective     HPI     URI   Associated symptoms inlclude congestion and cough.  Onset: For a week in half.  The temperature has been with in normal range.  Patient  is drinking plenty of fluids.  Past hisotry is significant for  pneumonia. Additional comments: Patient has been taking Delsym and Mucinex      Last edited by Marjie Skiff, CMA on 11/28/2023 11:02 AM.       Discussed the use of AI scribe software for clinical note transcription with the patient, who gave verbal consent to proceed.  History of Present Illness   The patient, with a history of Meniere's disease, migraines, and seasonal allergies, presents with a persistent, non-productive cough and congestion that started about two weeks ago. The cough has been severe, sometimes leading to violent coughing episodes. The patient has been taking Mucinex to break up phlegm and Delsym for cough relief. The patient reports that the cough has not improved and describes feeling a little "toxic" but denies having a fever. The patient also mentions that the previous night was the worst in terms of cough severity. The patient does not report any issues with sinuses, stating they are clear and breathing is not a problem. The patient uses Flonase daily for seasonal allergies and does not smoke. The patient maintains a healthy diet and has a higher than usual blood pressure reading during the visit.           08/29/2023    8:22 AM 06/26/2023    9:13 AM 05/26/2023    8:34 AM  Depression screen PHQ 2/9  Decreased Interest 0 0 0  Down, Depressed, Hopeless 0 0 0  PHQ - 2 Score 0 0 0       No data to display          Medications: Outpatient Medications  Prior to Visit  Medication Sig   allopurinol (ZYLOPRIM) 100 MG tablet TAKE 2 TABLETS BY MOUTH DAILY   betamethasone valerate ointment (VALISONE) 0.1 % Apply topically.   colchicine 0.6 MG tablet TAKE TWO TABLETS BY MOUTH ON FIRST DAY, THEN TAKE ONE TABLET BY MOUTH DAILY   fluticasone (FLONASE) 50 MCG/ACT nasal spray Place 2 sprays into both nostrils daily.   hydrochlorothiazide (HYDRODIURIL) 50 MG tablet Take 50 mg by mouth daily.   indomethacin (INDOCIN) 50 MG capsule TAKE ONE CAPSULE BY MOUTH THREE TIMES A DAY WITH FOOD AS NEEDED FOR GOUT FLARES   KLOR-CON M20 20 MEQ tablet Take 20 mEq by mouth daily.   Multiple Vitamin (MULTIVITAMIN) capsule Take 1 capsule by mouth daily.   predniSONE (DELTASONE) 10 MG tablet Take 10 mg by mouth 2 (two) times a week.   rosuvastatin (CRESTOR) 5 MG tablet Take 1 tablet (5 mg total) by mouth daily.   No facility-administered medications prior to visit.    Review of Systems All negative Except see HPI       Objective    BP 136/76 (BP Location: Left Arm, Patient Position: Sitting, Cuff Size: Normal)   Pulse 74   Resp 16   Wt 188 lb 8 oz (85.5 kg)   SpO2 100%   BMI  22.35 kg/m     Physical Exam Vitals reviewed.  Constitutional:      General: He is in acute distress.     Appearance: Normal appearance. He is not ill-appearing, toxic-appearing or diaphoretic.  HENT:     Head: Normocephalic and atraumatic.     Right Ear: Tympanic membrane, ear canal and external ear normal.     Left Ear: Tympanic membrane, ear canal and external ear normal.     Nose: Congestion and rhinorrhea present.     Mouth/Throat:     Pharynx: Posterior oropharyngeal erythema present.     Comments: Postnasal drainage Eyes:     General: No scleral icterus.       Right eye: No discharge.        Left eye: No discharge.     Extraocular Movements: Extraocular movements intact.     Conjunctiva/sclera: Conjunctivae normal.     Pupils: Pupils are equal, round, and reactive  to light.  Cardiovascular:     Rate and Rhythm: Normal rate and regular rhythm.     Pulses: Normal pulses.     Heart sounds: Normal heart sounds. No murmur heard. Pulmonary:     Effort: Pulmonary effort is normal. No respiratory distress.     Breath sounds: Normal breath sounds. No wheezing or rhonchi.  Abdominal:     General: Abdomen is flat. Bowel sounds are normal.     Palpations: Abdomen is soft.  Musculoskeletal:        General: Normal range of motion.     Cervical back: Normal range of motion and neck supple.     Right lower leg: No edema.     Left lower leg: No edema.  Lymphadenopathy:     Cervical: No cervical adenopathy.  Skin:    General: Skin is warm and dry.     Findings: No rash.  Neurological:     General: No focal deficit present.     Mental Status: He is alert and oriented to person, place, and time. Mental status is at baseline.  Psychiatric:        Behavior: Behavior normal.        Thought Content: Thought content normal.      No results found for any visits on 11/28/23.      Assessment and Plan    Upper Respiratory Infection Persistent cough and congestion for two weeks, with minimal improvement. No fever today, but reports feeling unwell. Noted occasional wheezing, but primarily around the larynx. No history of asthma or COPD. -Start Azithromycin (Z-Pak) due to persistent symptoms and presence of enlarged cervical lymph nodes. -Continue Mucinex and Delsym as needed for cough and phlegm. -Add Claritin for potential allergy component. -Consider warm salt gargle and nasal saline rinse followed by Flonase for nasal congestion. -Check in if symptoms worsen or do not improve.  Elevated Blood Pressure Blood pressure measured at 136/84, which is high for the patient. No history of hypertension and no current antihypertensive medication. -Monitor blood pressure periodically. -Consider follow-up with primary care provider (Dr. Sherrie Mustache) if blood pressure remains  elevated.      No orders of the defined types were placed in this encounter.   No follow-ups on file.   The patient was advised to call back or seek an in-person evaluation if the symptoms worsen or if the condition fails to improve as anticipated.  I discussed the assessment and treatment plan with the patient. The patient was provided an opportunity to ask questions and all were  answered. The patient agreed with the plan and demonstrated an understanding of the instructions.  I, Debera Lat, PA-C have reviewed all documentation for this visit. The documentation on 11/28/2023  for the exam, diagnosis, procedures, and orders are all accurate and complete.  Debera Lat, [facility], MMS [facility] [phone] (phone) [phone] (fax)  [facility]

## 2024-05-26 ENCOUNTER — Ambulatory Visit (INDEPENDENT_AMBULATORY_CARE_PROVIDER_SITE_OTHER): Payer: Self-pay | Admitting: Family Medicine

## 2024-05-26 VITALS — BP 111/62 | HR 65 | Resp 16 | Ht 77.0 in | Wt 181.5 lb

## 2024-05-26 DIAGNOSIS — Z0000 Encounter for general adult medical examination without abnormal findings: Secondary | ICD-10-CM

## 2024-05-26 DIAGNOSIS — E781 Pure hyperglyceridemia: Secondary | ICD-10-CM

## 2024-05-26 DIAGNOSIS — R7303 Prediabetes: Secondary | ICD-10-CM | POA: Diagnosis not present

## 2024-05-26 DIAGNOSIS — Z860101 Personal history of adenomatous and serrated colon polyps: Secondary | ICD-10-CM

## 2024-05-26 DIAGNOSIS — H8109 Meniere's disease, unspecified ear: Secondary | ICD-10-CM

## 2024-05-26 DIAGNOSIS — Z125 Encounter for screening for malignant neoplasm of prostate: Secondary | ICD-10-CM | POA: Diagnosis not present

## 2024-05-26 DIAGNOSIS — E790 Hyperuricemia without signs of inflammatory arthritis and tophaceous disease: Secondary | ICD-10-CM

## 2024-05-26 DIAGNOSIS — Z800 Family history of malignant neoplasm of digestive organs: Secondary | ICD-10-CM

## 2024-05-26 NOTE — Patient Instructions (Signed)
 Please review the attached list of medications and notify my office if there are any errors.   I strongly recommend that you get the Prevnar 20 vaccine to protect yourself from certain dangerous strains of pneumonia. You can get Prevnar 20 at your pharmacy, or call our office at [phone] at your earliest convenience to schedule this vaccine.   The CDC recommends two doses of Shingrix (the shingles vaccine) separated by 2 to 6 months for adults age 64 years and older. I recommend checking with your insurance plan regarding coverage for this vaccine.

## 2024-05-26 NOTE — Progress Notes (Signed)
 Complete physical exam   Patient: Paul Espinoza   DOB: [birth year]   63 y.o. Male  MRN: [id number] Visit Date: 05/26/2024  Today's healthcare provider: Nancyann Perry, MD   Chief Complaint  Patient presents with   Annual Exam    Patient doing well. He reports exercising.   Subjective     Discussed the use of AI scribe software for clinical note transcription with the patient, who gave verbal consent to proceed.  History of Present Illness   Paul Espinoza is a 62 year old male who presents for a routine physical exam and blood test to monitor glucose and cholesterol levels.  He has been making dietary changes, including eliminating cheese, avoiding fatty meats, maintaining a low sodium and low carb diet, and increasing his intake of whole grains, vegetables, and fruits. He remains physically active, regularly exercising throughout the week.  He has a history of neuropathy, which has not worsened over the past few years. The neuropathy began after receiving the COVID vaccine, although he acknowledges this may be coincidental.  He has Meniere's disease and had a recent checkup in [place] earlier this week. He reports that he has had a good year with Meniere's disease, without any new or unusual hearing issues beyond his current baseline.  He has not experienced any episodes of gout this year and continues to take allopurinol , which he finds effective. He is mindful of dietary triggers for gout and has not needed colchicine  for a while.     Lab Results  Component Value Date   HGBA1C 6.3 (H) 08/29/2023   HGBA1C 6.6 (H) 05/26/2023   HGBA1C 5.8 (H) 05/15/2022   Lab Results  Component Value Date   NA 140 05/26/2023   K 3.8 05/26/2023   CREATININE 1.03 05/26/2023   EGFR 82 05/26/2023   GLUCOSE 151 (H) 05/26/2023   Lab Results  Component Value Date   LABURIC 6.4 05/26/2023     Past Medical History:  Diagnosis Date   Cataract Mar 25, 2022   COVID-19  12/20/2022   History of chicken pox    Neuromuscular disorder [facility]) Sep 18, 2021   Squamous cell carcinoma of skin 01/31/2020   left medial cheek   Vertigo    Past Surgical History:  Procedure Laterality Date   COLONOSCOPY WITH PROPOFOL  N/A 10/09/2015   Procedure: COLONOSCOPY WITH PROPOFOL ;  Surgeon: Deward CINDERELLA Piedmont, MD;  Location: [facility] ENDOSCOPY;  Service: Gastroenterology;  Laterality: N/A;   Social History   Socioeconomic History   Marital status: Married    Spouse name: Not on file   Number of children: Not on file   Years of education: Not on file   Highest education level: Bachelor's degree (e.g., BA, AB, BS)  Occupational History    Comment: CEO [employer]  Tobacco Use   Smoking status: Never   Smokeless tobacco: Never  Vaping Use   Vaping status: Never Used  Substance and Sexual Activity   Alcohol use: Yes    Alcohol/week: 5.0 standard drinks of alcohol    Types: 2 Glasses of wine, 1 Cans of beer, 2 Shots of liquor per week    Comment: occasional   Drug use: Never   Sexual activity: Yes    Birth control/protection: None  Other Topics Concern   Not on file  Social History Narrative   Not on file   Social Drivers of Health   Financial Resource Strain: Low Risk  (05/26/2024)   Overall  Financial Resource Strain (CARDIA)    Difficulty of Paying Living Expenses: Not hard at all  Food Insecurity: No Food Insecurity (05/26/2024)   Hunger Vital Sign    Worried About Running Out of Food in the Last Year: Never true    Ran Out of Food in the Last Year: Never true  Transportation Needs: No Transportation Needs (05/26/2024)   PRAPARE - Administrator, Civil Service (Medical): No    Lack of Transportation (Non-Medical): No  Physical Activity: Sufficiently Active (05/26/2024)   Exercise Vital Sign    Days of Exercise per Week: 5 days    Minutes of Exercise per Session: 40 min  Stress: No Stress Concern Present (05/26/2024)   [employer]  of Occupational Health - Occupational Stress Questionnaire    Feeling of Stress: Only a little  Social Connections: Socially Integrated (05/26/2024)   Social Connection and Isolation Panel    Frequency of Communication with Friends and Family: More than three times a week    Frequency of Social Gatherings with Friends and Family: Three times a week    Attends Religious Services: 1 to 4 times per year    Active Member of Clubs or Organizations: Yes    Attends Banker Meetings: 1 to 4 times per year    Marital Status: Married  Catering manager Violence: Patient Declined (05/26/2024)   Humiliation, Afraid, Rape, and Kick questionnaire    Fear of Current or Ex-Partner: Patient declined    Emotionally Abused: Patient declined    Physically Abused: Patient declined    Sexually Abused: Patient declined   Family Status  Relation Name Status   Mother Priscilla Deceased   Father Kayla Deceased at age 92   Brother Industrial/product designer   Brother  Alive   Brother  Alive   PGM Grandma Hammock (Not Specified)  No partnership data on file   Family History  Problem Relation Age of Onset   Heart disease Mother    Breast cancer Mother    Colon polyps Mother    Cancer Mother    Colon cancer Father    Colon polyps Father    Heart disease Father    Cancer Father    Colon cancer Brother    Cancer Brother    Diabetes Paternal Grandmother    No Known Allergies  Patient Care Team: Gasper Nancyann BRAVO, MD as PCP - General (Family Medicine) Hester Alm BROCKS, MD (Dermatology) Aundria Ladell POUR, MD as Consulting Physician (Gastroenterology) Garry Teddie Norleen ONEIDA, MD as Referring Physician (Otolaryngology)   Medications: Outpatient Medications Prior to Visit  Medication Sig   allopurinol  (ZYLOPRIM ) 100 MG tablet TAKE 2 TABLETS BY MOUTH DAILY   betamethasone valerate ointment (VALISONE) 0.1 % Apply topically.   colchicine  0.6 MG tablet TAKE TWO TABLETS BY MOUTH ON FIRST DAY, THEN TAKE ONE TABLET BY MOUTH  DAILY   fluticasone (FLONASE) 50 MCG/ACT nasal spray Place 2 sprays into both nostrils daily.   hydrochlorothiazide (HYDRODIURIL) 50 MG tablet Take 50 mg by mouth daily.   indomethacin  (INDOCIN ) 50 MG capsule TAKE ONE CAPSULE BY MOUTH THREE TIMES A DAY WITH FOOD AS NEEDED FOR GOUT FLARES   KLOR-CON M20 20 MEQ tablet Take 20 mEq by mouth daily.   Multiple Vitamin (MULTIVITAMIN) capsule Take 1 capsule by mouth daily.   predniSONE (DELTASONE) 10 MG tablet Take 10 mg by mouth 2 (two) times a week.   No facility-administered medications prior to visit.    Review of  Systems  Constitutional:  Negative for appetite change, chills and fever.  Respiratory:  Negative for chest tightness, shortness of breath and wheezing.   Cardiovascular:  Negative for chest pain and palpitations.  Gastrointestinal:  Negative for abdominal pain, nausea and vomiting.      Objective    BP 111/62 (BP Location: Left Arm, Patient Position: Sitting, Cuff Size: Normal)   Pulse 65   Resp 16   Ht 6' 5 (1.956 m)   Wt 181 lb 8 oz (82.3 kg)   SpO2 100%   BMI 21.52 kg/m    Physical Exam   General Appearance:    Well developed, well nourished male. Alert, cooperative, in no acute distress, appears stated age  Head:    Normocephalic, without obvious abnormality, atraumatic  Eyes:    PERRL, conjunctiva/corneas clear, EOM's intact, fundi    benign, both eyes       Ears:    Normal TM's and external ear canals, both ears  Nose:   Nares normal, septum midline, mucosa normal, no drainage   or sinus tenderness  Throat:   Lips, mucosa, and tongue normal; teeth and gums normal  Neck:   Supple, symmetrical, trachea midline, no adenopathy;       thyroid:  No enlargement/tenderness/nodules; no carotid   bruit or JVD  Back:     Symmetric, no curvature, ROM normal, no CVA tenderness  Lungs:     Clear to auscultation bilaterally, respirations unlabored  Chest wall:    No tenderness or deformity  Heart:    Normal heart rate.  Normal rhythm. No murmurs, rubs, or gallops.  S1 and S2 normal  Abdomen:     Soft, non-tender, bowel sounds active all four quadrants,    no masses, no organomegaly  Genitalia:    deferred  Rectal:    deferred  Extremities:   All extremities are intact. No cyanosis or edema  Pulses:   2+ and symmetric all extremities  Skin:   Skin color, texture, turgor normal, no rashes or lesions  Lymph nodes:   Cervical, supraclavicular, and axillary nodes normal  Neurologic:   CNII-XII intact. Normal strength, sensation and reflexes      throughout     Last depression screening scores    05/26/2024    8:21 AM 08/29/2023    8:22 AM 06/26/2023    9:13 AM  PHQ 2/9 Scores  PHQ - 2 Score 0 0 0  PHQ- 9 Score 1     Last fall risk screening    05/26/2024    8:21 AM  Fall Risk   Falls in the past year? 0  Number falls in past yr: 0  Injury with Fall? 0  Risk for fall due to : No Fall Risks   Last Audit-C alcohol use screening    05/26/2024    8:18 AM  Alcohol Use Disorder Test (AUDIT)  1. How often do you have a drink containing alcohol? 0  2. How many drinks containing alcohol do you have on a typical day when you are drinking? 0  3. How often do you have six or more drinks on one occasion? 0  AUDIT-C Score 0   A score of 3 or more in women, and 4 or more in men indicates increased risk for alcohol abuse, EXCEPT if all of the points are from question 1   No results found for any visits on 05/26/24.  Assessment & Plan    Routine Health Maintenance and Physical Exam  Exercise Activities and Dietary recommendations  Goals   None     Immunization History  Administered Date(s) Administered   Moderna Sars-Covid-2 Vaccination 05/12/2020, 06/09/2020, 12/08/2020   Tdap 04/24/2010, 04/26/2020    Health Maintenance  Topic Date Due   Zoster Vaccines- Shingrix (1 of 2) Never done   COVID-19 Vaccine (4 - 2024-25 season) 07/06/2023   HIV Screening  01/10/2027 (Originally 05/25/1976)    INFLUENZA VACCINE  06/04/2024   Colonoscopy  12/26/2025   DTaP/Tdap/Td (3 - Td or Tdap) 04/26/2030   Hepatitis C Screening  Completed   Hepatitis B Vaccines  Aged Out   HPV VACCINES  Aged Out   Meningococcal B Vaccine  Aged Out    Discussed health benefits of physical activity, and encouraged him to engage in regular exercise appropriate for his age and condition.  Recommendations for pneumococcal and Shingles vaccines discussed. Patient declined both.     3. Hypertriglyceridemia Following prudent diet.  - Comprehensive metabolic panel with GFR - Lipid panel - CBC  4. Prediabetes  - Hemoglobin A1c  5. Hyperuricemia No gout flares since being on current dose of allopurinol .   6. Meniere's disease, unspecified laterality Continue routine follow up Dr. McElveen   7. History of adenomatous polyp of colon Last colonoscopy without polyps in 2022  8. Family history of cancer of digestive system Continue  Q 5 years colonoscopy per Dr. Aundria, next due February 2027       Nancyann Perry, MD  [facility] [phone] (phone) [phone] (fax)  [facility]

## 2024-05-27 ENCOUNTER — Ambulatory Visit: Payer: Self-pay | Admitting: Family Medicine

## 2024-05-27 LAB — COMPREHENSIVE METABOLIC PANEL WITH GFR
ALT: 20 IU/L (ref 0–44)
AST: 17 IU/L (ref 0–40)
Albumin: 4.4 g/dL (ref 3.9–4.9)
Alkaline Phosphatase: 59 IU/L (ref 44–121)
BUN/Creatinine Ratio: 21 (ref 10–24)
BUN: 25 mg/dL (ref 8–27)
Bilirubin Total: 0.6 mg/dL (ref 0.0–1.2)
CO2: 28 mmol/L (ref 20–29)
Calcium: 9.2 mg/dL (ref 8.6–10.2)
Chloride: 98 mmol/L (ref 96–106)
Creatinine, Ser: 1.17 mg/dL (ref 0.76–1.27)
Globulin, Total: 2 g/dL (ref 1.5–4.5)
Glucose: 132 mg/dL — ABNORMAL HIGH (ref 70–99)
Potassium: 3.8 mmol/L (ref 3.5–5.2)
Sodium: 140 mmol/L (ref 134–144)
Total Protein: 6.4 g/dL (ref 6.0–8.5)
eGFR: 70 mL/min/1.73 (ref >59)

## 2024-05-27 LAB — CBC
Hematocrit: 46.8 % (ref 37.5–51.0)
Hemoglobin: 15.6 g/dL (ref 13.0–17.7)
MCH: 32.4 pg (ref 26.6–33.0)
MCHC: 33.3 g/dL (ref 31.5–35.7)
MCV: 97 fL (ref 79–97)
Platelets: 167 x10E3/uL (ref 150–450)
RBC: 4.81 x10E6/uL (ref 4.14–5.80)
RDW: 12.7 % (ref 11.6–15.4)
WBC: 6.3 x10E3/uL (ref 3.4–10.8)

## 2024-05-27 LAB — LIPID PANEL
Chol/HDL Ratio: 4.9 ratio (ref 0.0–5.0)
Cholesterol, Total: 200 mg/dL — ABNORMAL HIGH (ref 100–199)
HDL: 41 mg/dL (ref >39)
LDL Chol Calc (NIH): 139 mg/dL — ABNORMAL HIGH (ref 0–99)
Triglycerides: 109 mg/dL (ref 0–149)
VLDL Cholesterol Cal: 20 mg/dL (ref 5–40)

## 2024-05-27 LAB — PSA TOTAL (REFLEX TO FREE): Prostate Specific Ag, Serum: 0.6 ng/mL (ref 0.0–4.0)

## 2024-05-27 LAB — HEMOGLOBIN A1C
Est. average glucose Bld gHb Est-mCnc: 123 mg/dL
Hgb A1c MFr Bld: 5.9 % — ABNORMAL HIGH (ref 4.8–5.6)

## 2024-07-21 ENCOUNTER — Ambulatory Visit (INDEPENDENT_AMBULATORY_CARE_PROVIDER_SITE_OTHER): Payer: 59 | Admitting: Dermatology

## 2024-07-21 ENCOUNTER — Encounter: Payer: Self-pay | Admitting: Dermatology

## 2024-07-21 DIAGNOSIS — L219 Seborrheic dermatitis, unspecified: Secondary | ICD-10-CM | POA: Diagnosis not present

## 2024-07-21 DIAGNOSIS — Z79899 Other long term (current) drug therapy: Secondary | ICD-10-CM

## 2024-07-21 DIAGNOSIS — L853 Xerosis cutis: Secondary | ICD-10-CM

## 2024-07-21 DIAGNOSIS — W908XXA Exposure to other nonionizing radiation, initial encounter: Secondary | ICD-10-CM | POA: Diagnosis not present

## 2024-07-21 DIAGNOSIS — D1801 Hemangioma of skin and subcutaneous tissue: Secondary | ICD-10-CM

## 2024-07-21 DIAGNOSIS — L814 Other melanin hyperpigmentation: Secondary | ICD-10-CM

## 2024-07-21 DIAGNOSIS — L820 Inflamed seborrheic keratosis: Secondary | ICD-10-CM

## 2024-07-21 DIAGNOSIS — L570 Actinic keratosis: Secondary | ICD-10-CM | POA: Diagnosis not present

## 2024-07-21 DIAGNOSIS — L578 Other skin changes due to chronic exposure to nonionizing radiation: Secondary | ICD-10-CM | POA: Diagnosis not present

## 2024-07-21 DIAGNOSIS — Z8589 Personal history of malignant neoplasm of other organs and systems: Secondary | ICD-10-CM

## 2024-07-21 DIAGNOSIS — Z1283 Encounter for screening for malignant neoplasm of skin: Secondary | ICD-10-CM | POA: Diagnosis not present

## 2024-07-21 DIAGNOSIS — Z7189 Other specified counseling: Secondary | ICD-10-CM

## 2024-07-21 DIAGNOSIS — D229 Melanocytic nevi, unspecified: Secondary | ICD-10-CM

## 2024-07-21 DIAGNOSIS — Z85828 Personal history of other malignant neoplasm of skin: Secondary | ICD-10-CM

## 2024-07-21 MED ORDER — KETOCONAZOLE 2 % EX SHAM: MEDICATED_SHAMPOO | CUTANEOUS | 11 refills | Status: AC

## 2024-07-21 NOTE — Progress Notes (Signed)
 Follow-Up Visit   Subjective  Paul Espinoza is a 63 y.o. male who presents for the following: Skin Cancer Screening and Full Body Skin Exam  The patient presents for Total-Body Skin Exam (TBSE) for skin cancer screening and mole check. The patient has spots, moles and lesions to be evaluated, some may be new or changing and the patient may have concern these could be cancer.  The following portions of the chart were reviewed this encounter and updated as appropriate: medications, allergies, medical history  Review of Systems:  No other skin or systemic complaints except as noted in HPI or Assessment and Plan.  Objective  Well appearing patient in no apparent distress; mood and affect are within normal limits.  A full examination was performed including scalp, head, eyes, ears, nose, lips, neck, chest, axillae, abdomen, back, buttocks, bilateral upper extremities, bilateral lower extremities, hands, feet, fingers, toes, fingernails, and toenails. All findings within normal limits unless otherwise noted below.   Relevant physical exam findings are noted in the Assessment and Plan.  Scalp x 1, L hand x 3 (4) Erythematous stuck-on, waxy papule or plaque R ear x 3, L ear x 1, L temple x 1 (5) Erythematous thin papules/macules with gritty scale.   Assessment & Plan   SKIN CANCER SCREENING PERFORMED TODAY.  ACTINIC DAMAGE - Chronic condition, secondary to cumulative UV/sun exposure - diffuse scaly erythematous macules with underlying dyspigmentation - Recommend daily broad spectrum sunscreen SPF 30+ to sun-exposed areas, reapply every 2 hours as needed.  - Staying in the shade or wearing long sleeves, sun glasses (UVA+UVB protection) and wide brim hats (4-inch brim around the entire circumference of the hat) are also recommended for sun protection.  - Call for new or changing lesions.  LENTIGINES, SEBORRHEIC KERATOSES, HEMANGIOMAS - Benign normal skin lesions - Benign-appearing -  Call for any changes  MELANOCYTIC NEVI - Tan-brown and/or pink-flesh-colored symmetric macules and papules - Benign appearing on exam today - Observation - Call clinic for new or changing moles - Recommend daily use of broad spectrum spf 30+ sunscreen to sun-exposed areas.   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  - Call if any new or changing lesions are noted between office visits  SEBORRHEIC DERMATITIS Exam: Pink patches with greasy scale at scalp Chronic and persistent condition with duration or expected duration over one year. Condition is symptomatic/ bothersome to patient. Not currently at goal. Seborrheic Dermatitis is a chronic persistent rash characterized by pinkness and scaling most commonly of the mid face but also can occur on the scalp (dandruff), ears; mid chest, mid back and groin.  It tends to be exacerbated by stress and cooler weather.  People who have neurologic disease may experience new onset or exacerbation of existing seborrheic dermatitis.  The condition is not curable but treatable and can be controlled. Treatment Plan: Start ketoconazole  2% apply three times per week, massage into scalp and leave in for 5-10 minutes before rinsing out.   INFLAMED SEBORRHEIC KERATOSIS (4) Scalp x 1, L hand x 3 (4) Symptomatic, irritating, patient would like treated.  Benign-appearing.  Call clinic for new or changing lesions.   Destruction of lesion - Scalp x 1, L hand x 3 (4) Complexity: simple   Destruction method: cryotherapy  Informed consent: discussed and consent obtained   Timeout:  patient name, date of birth, surgical site, and procedure verified Lesion destroyed using liquid nitrogen: Yes   Region frozen until ice ball extended beyond  lesion: Yes   Outcome: patient tolerated procedure well with no complications   Post-procedure details: wound care instructions given    AK (ACTINIC KERATOSIS) (5) R ear x 3, L ear x 1, L temple x 1 (5) Actinic keratoses are precancerous spots that appear secondary to cumulative UV radiation exposure/sun exposure over time. They are chronic with expected duration over 1 year. A portion of actinic keratoses will progress to squamous cell carcinoma of the skin. It is not possible to reliably predict which spots will progress to skin cancer and so treatment is recommended to prevent development of skin cancer.  Recommend daily broad spectrum sunscreen SPF 30+ to sun-exposed areas, reapply every 2 hours as needed.  Recommend staying in the shade or wearing long sleeves, sun glasses (UVA+UVB protection) and wide brim hats (4-inch brim around the entire circumference of the hat). Call for new or changing lesions.  Destruction of lesion - R ear x 3, L ear x 1, L temple x 1 (5) Complexity: simple   Destruction method: cryotherapy   Informed consent: discussed and consent obtained   Timeout:  patient name, date of birth, surgical site, and procedure verified Lesion destroyed using liquid nitrogen: Yes   Region frozen until ice ball extended beyond lesion: Yes   Outcome: patient tolerated procedure well with no complications   Post-procedure details: wound care instructions given     Xerosis - diffuse xerotic patches on the feet - recommend gentle, hydrating skin care - gentle skin care handout given  Return in about 1 year (around 07/21/2025) for TBSE - hx SCC, AK, ISK.  I, Rosina Mayans, CMA, am acting as scribe for Alm Rhyme, MD .   Documentation: I have reviewed the above documentation for accuracy and completeness, and I agree with the above.  Alm Rhyme, MD

## 2024-07-21 NOTE — Patient Instructions (Signed)

## 2024-09-27 ENCOUNTER — Telehealth: Payer: Self-pay

## 2024-09-27 DIAGNOSIS — R7303 Prediabetes: Secondary | ICD-10-CM

## 2024-09-27 MED ORDER — METFORMIN HCL ER 500 MG PO TB24
500.0000 mg | ORAL_TABLET | Freq: Every day | ORAL | 3 refills | Status: AC
Start: 2024-09-27 — End: ?

## 2024-09-27 NOTE — Telephone Encounter (Signed)
 Copied from CRM [id number]. Topic: Clinical - Medication Question >> Sep 27, 2024  2:46 PM Myrick T wrote: Reason for CRM: patient would like to start the Metformin . Please send in script for medication   ----------------------------------------------------------------------- From previous Reason for Contact - Medication Refill: Medication:   Has the patient contacted their pharmacy?   (Agent: If no, request that the patient contact the pharmacy for the refill. If patient does not wish to contact the pharmacy document the reason why and proceed with request.) (Agent: If yes, when and what did the pharmacy advise?)  This is the patient's preferred pharmacy:  [id number] [place], [place] - [address] [place] [address] Phone: [phone] Fax: [phone]  Is this the correct pharmacy for this prescription?   If no, delete pharmacy and type the correct one.   Has the prescription been filled recently?    Is the patient out of the medication?    Has the patient been seen for an appointment in the last year OR does the patient have an upcoming appointment?    Can we respond through MyChart?    Agent: Please be advised that Rx refills may take up to 3 business days. We ask that you follow-up with your pharmacy. >> Sep 27, 2024  2:50 PM Myrick T wrote: Patient needs script sent to  [id number] - [place], [place] - [address] Phone: [phone]  Fax: [phone]

## 2024-09-27 NOTE — Telephone Encounter (Signed)
 Prescription sent to [facility] for prediabetes. Please schedule follow up app in 3 months.

## 2024-09-29 NOTE — Telephone Encounter (Signed)
 Patient advised and scheduled.

## 2024-10-18 ENCOUNTER — Ambulatory Visit: Payer: Self-pay

## 2024-10-18 NOTE — Telephone Encounter (Signed)
 FYI Only or Action Required?: Action required by provider: clinical question for provider.  Patient was last seen in primary care on 05/26/2024 by Gasper Nancyann BRAVO, MD.  Called Nurse Triage reporting Blood Sugar Problem.  Symptoms began today.  Interventions attempted: Prescription medications: Metformin .  Symptoms are: stable.  Triage Disposition: Home Care  Patient/caregiver understands and will follow disposition?: Yes Reason for Disposition  Blood glucose 70-240 mg/dL (3.9 -86.6 mmol/L)  Answer Assessment - Initial Assessment Questions Patient having issues with ears, Audiologist started Prednisone 10mg  Saturday. 14 days course. Patient wondering if he should shorten the dose of Prednisone and/ or up the Metformin . Patient mentioned he did not tell Audiologist he has diabetes, advised patient to call audiologist and let them know to see if any adjustments needs to be made on the Prednisone. Advised patient to keep checking blood sugar, stay hydrated and limit carbs and sugar. Patient would like advice from PCP regarding his Metformin  and if there is anything else he needs to do. Please advise.   1. BLOOD GLUCOSE: What is your blood glucose level?      232  2. ONSET: When did you check the blood glucose?     40 minutes  3. USUAL RANGE: What is your glucose level usually? (e.g., usual fasting morning value, usual evening value)     165 average, fasting  4. TYPE 1 or 2:  Do you know what type of diabetes you have?  (e.g., Type 1, Type 2, Gestational; doesn't know)      Type 2   5. DIABETES PILLS: Do you take any pills for your diabetes? If Yes, ask: Have you missed taking any pills recently?     Metformin  500mg  1x daily, started 09/23/24  6. OTHER SYMPTOMS: Do you have any symptoms? (e.g., fever, frequent urination, difficulty breathing, dizziness, weakness, vomiting)     Denies  Protocols used: Diabetes - High Blood Sugar-A-AH  Copied from CRM #[id number]. Topic:  Clinical - Red Word Triage >> Oct 18, 2024  2:02 PM Uyopbjy D wrote: predniSONE (DELTASONE) 10 MG tablet - Sugar levels this morning 254, right now 232.

## 2024-10-18 NOTE — Telephone Encounter (Signed)
 Patient advised.

## 2024-10-18 NOTE — Telephone Encounter (Signed)
 Increase in blood sugar is typical while taking prednisone and will come back down when he finishes. Is not a concern unless his sugar goes above 300, in which case he can take a second daily dose of metfomin

## 2025-01-03 ENCOUNTER — Ambulatory Visit: Admitting: Family Medicine

## 2025-06-01 ENCOUNTER — Encounter: Admitting: Family Medicine

## 2025-07-27 ENCOUNTER — Ambulatory Visit: Admitting: Dermatology

## 2025-08-11 ENCOUNTER — Ambulatory Visit: Admitting: Dermatology
